# Patient Record
Sex: Male | Born: 1995
Health system: Southern US, Community
[De-identification: ages and names within clinical notes are randomized; demographics above are authoritative.]

---

## 2013-12-26 ENCOUNTER — Ambulatory Visit: Payer: BC Managed Care – PPO

## 2013-12-26 ENCOUNTER — Ambulatory Visit (INDEPENDENT_AMBULATORY_CARE_PROVIDER_SITE_OTHER): Payer: BC Managed Care – PPO | Admitting: Family Medicine

## 2013-12-26 VITALS — BP 110/70 | HR 52 | Temp 98.9°F | Resp 18 | Ht 66.0 in | Wt 151.8 lb

## 2013-12-26 DIAGNOSIS — M79609 Pain in unspecified limb: Secondary | ICD-10-CM

## 2013-12-26 DIAGNOSIS — M79672 Pain in left foot: Secondary | ICD-10-CM

## 2013-12-26 DIAGNOSIS — S92909A Unspecified fracture of unspecified foot, initial encounter for closed fracture: Secondary | ICD-10-CM

## 2013-12-26 NOTE — Progress Notes (Signed)
   Subjective:    Patient ID: Jackson Williams, male    DOB: 03-Oct-1995, 18 y.o.   MRN: [id number]  HPI Patient is a 18 year old from [place], who attends [employer]. 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 He jumped and is not sure if he landed with the toe flexed or bumped into someone or if someone landed on it. Patient took ibuprofen 400 mg with some relief of pain. He applied some cream that he brought from [place], but not sure what it contains.  Patient has no medical or surgical history. His parents are alive and well.    Review of Systems     Objective:   Physical Exam  Vitals reviewed. Constitutional: He is oriented to person, place, and time. He appears well-developed and well-nourished.  Eyes: Conjunctivae are normal.  Neck: Normal range of motion. Neck supple.  Pulmonary/Chest: Effort normal.  Neurological: He is alert and oriented to person, place, and time.  Skin: Skin is warm and dry.  Psychiatric: He has a normal mood and affect. His behavior is normal. Judgment and thought content normal.   Left foot with ecymosis covering 1 st, 2 nd and medial aspect of 3 rd toe. Pain with flexion and extension of toes, tender over metatarsals. Redness also over medial area of great toe.   Left foot xray: [facility] reading (PRIMARY) by  Dr. Katrinka BlazingSmith: Distal phalanx fracture at the IP joint.      Assessment & Plan:  1. Foot fracture - Ambulatory referral to Orthopedic Surgery. Ortho offices closed tomorrow Peri Jefferson(Good Friday), will ask for him to be seen at the beginning of next week. -Short cam boot fitted/applied -Ibuprofen 400 mg every 8 hours as needed for pain  2. Left foot pain - DG Foot Complete Left; Future   Emi Belfasteborah B. Gessner, FNP-BC  [facility]  12/26/2013 9:04 PM

## 2013-12-26 NOTE — Patient Instructions (Signed)
Wear protective boot when walking. Take ibuprofen 400 mg by mouth as needed for pain Our office will call with a referral to an orthopaedist

## 2014-12-26 ENCOUNTER — Ambulatory Visit (INDEPENDENT_AMBULATORY_CARE_PROVIDER_SITE_OTHER): Payer: 59 | Admitting: Family Medicine

## 2014-12-26 VITALS — BP 110/68 | HR 68 | Temp 97.6°F | Resp 14 | Ht 66.0 in | Wt 146.8 lb

## 2014-12-26 DIAGNOSIS — L03319 Cellulitis of trunk, unspecified: Secondary | ICD-10-CM | POA: Diagnosis not present

## 2014-12-26 DIAGNOSIS — R21 Rash and other nonspecific skin eruption: Secondary | ICD-10-CM | POA: Diagnosis not present

## 2014-12-26 MED ORDER — CEPHALEXIN 500 MG PO CAPS: 500.0000 mg | ORAL_CAPSULE | Freq: Two times a day (BID) | ORAL | Status: DC

## 2014-12-26 MED ORDER — PREDNISONE 20 MG PO TABS: 40.0000 mg | ORAL_TABLET | Freq: Every day | ORAL | Status: DC

## 2014-12-26 NOTE — Progress Notes (Signed)
This chart was scribed for Dr. Elvina Sidle, MD by Jarvis Morgan, Medical Scribe. This patient was seen in Room 3 and the patient's care was started at 2:53 PM.  Patient ID: Jackson Williams MRN: [id number], DOB: 1996-07-03, 19 y.o. Date of Encounter: 12/26/2014, 2:53 PM  Primary Physician: No primary care provider on file.  Chief Complaint:  Chief Complaint  Patient presents with   Rash    on left arm, neck, and chest x 1 week.      HPI: 19 y.o. year old male with history below presents with red, itchy rash to his left arm, right side of neck and chest for 1 week. He states they started out looking like bug bites and gradually became bigger, flatter and redder. Pt states the rash started to his left arm and neck. He denies rash to lower extremities. He denies any current medications. Pt denies any known allergies. Pt has no other complaints at this time.   History reviewed. No pertinent past medical history.   Home Meds: Prior to Admission medications   Not on File    Allergies: No Known Allergies  History   Social History   Marital Status: Single    Spouse Name: N/A   Number of Children: N/A   Years of Education: N/A   Occupational History   Not on file.   Social History Main Topics   Smoking status: Never Smoker    Smokeless tobacco: Not on file   Alcohol Use: No   Drug Use: No   Sexual Activity: Not on file   Other Topics Concern   Not on file   Social History Narrative     Review of Systems: Constitutional: negative for chills, fever, night sweats, weight changes, or fatigue  HEENT: negative for vision changes, hearing loss, congestion, rhinorrhea, ST, epistaxis, or sinus pressure Cardiovascular: negative for chest pain or palpitations Respiratory: negative for hemoptysis, wheezing, shortness of breath, or cough Abdominal: negative for abdominal pain, nausea, vomiting, diarrhea, or constipation Dermatological: positive for  rash Neurologic: negative for headache, dizziness, or syncope All other systems reviewed and are otherwise negative with the exception to those above and in the HPI.   Physical Exam: Blood pressure 110/68, pulse 68, temperature 97.6 F (36.4 C), temperature source Oral, resp. rate 14, height  (1.676 m), weight 146 lb 12.8 oz (66.588 kg), SpO2 99 %., Body mass index is 23.71 kg/(m^2). General: Well developed, well nourished, in no acute distress. Head: Normocephalic, atraumatic, eyes without discharge, sclera non-icteric, nares are without discharge. Bilateral auditory canals clear, TM's are without perforation, pearly grey and translucent with reflective cone of light bilaterally. Oral cavity moist, posterior pharynx without exudate, erythema, peritonsillar abscess, or post nasal drip.  Neck: Supple. No thyromegaly. Full ROM. No lymphadenopathy. Lungs: Clear bilaterally to auscultation without wheezes, rales, or rhonchi. Breathing is unlabored. Heart: RRR with S1 S2. No murmurs, rubs, or gallops appreciated. Abdomen: Soft, non-tender, non-distended with normoactive bowel sounds. No hepatomegaly. No rebound/guarding. No obvious abdominal masses. Msk:  Strength and tone normal for age. Extremities/Skin: Warm and dry. No clubbing or cyanosis. No edema. Indurated rash on his neck, right upper chest and left antecubital area. He also has multiple papules on the left chest and upper abdomen Neuro: Alert and oriented X 3. Moves all extremities spontaneously. Gait is normal. CNII-XII grossly in tact. Psych:  Responds to questions appropriately with a normal affect.   Labs:   ASSESSMENT AND PLAN:  19 y.o. year old male  with   1. Rash and nonspecific skin eruption   2. Cellulitis of trunk, unspecified site     Meds ordered this encounter  Medications   predniSONE (DELTASONE) 20 MG tablet    Sig: Take 2 tablets (40 mg total) by mouth daily.    Dispense:  10 tablet    Refill:  0    cephALEXin (KEFLEX) 500 MG capsule    Sig: Take 1 capsule (500 mg total) by mouth 2 (two) times daily.    Dispense:  14 capsule    Refill:  0   This chart was scribed in my presence and reviewed by me personally.    ICD-9-CM ICD-10-CM   1. Rash and nonspecific skin eruption 782.1 R21 predniSONE (DELTASONE) 20 MG tablet     cephALEXin (KEFLEX) 500 MG capsule  2. Cellulitis of trunk, unspecified site 682.2 L03.319 predniSONE (DELTASONE) 20 MG tablet     cephALEXin (KEFLEX) 500 MG capsule    Signed, Elvina SidleKurt Lauenstein, MD 12/26/2014 2:59 PM

## 2014-12-26 NOTE — Patient Instructions (Signed)
This rash has appearance of an allergic reaction it has gotten infected. This can happen after insect bites which I think is most likely

## 2015-01-02 ENCOUNTER — Ambulatory Visit (INDEPENDENT_AMBULATORY_CARE_PROVIDER_SITE_OTHER): Payer: 59 | Admitting: Family Medicine

## 2015-01-02 VITALS — BP 110/80 | HR 69 | Temp 98.2°F | Resp 16 | Ht 66.5 in | Wt 147.0 lb

## 2015-01-02 DIAGNOSIS — L03319 Cellulitis of trunk, unspecified: Secondary | ICD-10-CM

## 2015-01-02 DIAGNOSIS — R21 Rash and other nonspecific skin eruption: Secondary | ICD-10-CM | POA: Diagnosis not present

## 2015-01-02 MED ORDER — PREDNISONE 20 MG PO TABS: 40.0000 mg | ORAL_TABLET | Freq: Every day | ORAL | Status: DC

## 2015-01-02 MED ORDER — CLOTRIMAZOLE-BETAMETHASONE 1-0.05 % EX CREA: 1.0000 "application " | TOPICAL_CREAM | Freq: Two times a day (BID) | CUTANEOUS | Status: DC

## 2015-01-02 NOTE — Progress Notes (Signed)
Subjective:  This chart was scribed for Jackson SidleKurt Lauenstein, MD by Jackson Williams, Medical Scribe. This patient was seen in Room 13 and the patient's care was started at 2:44 PM.   Patient ID: Jackson Williams, male    DOB: December 23, 1995, 19 y.o.   MRN: [id number]  HPI HPI Comments: Jackson Williams is a 19 y.o. male who [facility] for a follow-up appointment regarding a rash on his left arm, right side of his neck, and chest.  The area on his neck and chest have resolved but the area on his left arm is still very red and itchy.  He has not noticed any rash between his fingers.  He does not recall being bitten by a tick.  He denies fever as an associated symptom.   No past medical history on file. No past surgical history on file. No family history on file. History   Social History   Marital Status: Single    Spouse Name: N/A   Number of Children: N/A   Years of Education: N/A   Occupational History   Not on file.   Social History Main Topics   Smoking status: Never Smoker    Smokeless tobacco: Not on file   Alcohol Use: No   Drug Use: No   Sexual Activity: Not on file   Other Topics Concern   Not on file   Social History Narrative   No Known Allergies  Review of Systems  Constitutional: Negative for fever.  Skin: Positive for rash.     Objective:  Physical Exam  Constitutional: He is oriented to person, place, and time. He appears well-developed and well-nourished.  HENT:  Head: Normocephalic and atraumatic.  Eyes: EOM are normal.  Neck: Normal range of motion.  Cardiovascular: Normal rate.   Pulmonary/Chest: Effort normal.  Musculoskeletal: Normal range of motion.  Neurological: He is alert and oriented to person, place, and time.  Skin: Skin is warm and dry. Rash noted.  Psychiatric: He has a normal mood and affect. His behavior is normal.  Nursing note and vitals reviewed.  is a very interesting distribution of  rash which is actually improving everywhere. He has a confluent area in his right neck which is dry and scaly, no longer elevated but definitely erythematous. He has an area on his left antecubital arm that is the same characteristics is what he has on his neck: Dry central area with scaly S and diffuse erythema Finally he has an area on his left chest which is more papular although the papules are becoming confluent. The rash once again is erythematous but turning violaceous and assess it with some scaling suggesting resolution.  He has no palmar or interdigital rash   BP 110/80 mmHg   Pulse 69   Temp(Src) 98.2 F (36.8 C) (Oral)   Resp 16   Ht 5' 6.5" (1.689 m)   Wt 147 lb (66.679 kg)   BMI 23.37 kg/m2   SpO2 99% Assessment & Plan:   Peculiar rash sits most consistent with cellulitis stemming from an allergic reaction This chart was scribed in my presence and reviewed by me personally.    ICD-9-CM ICD-10-CM   1. Rash and nonspecific skin eruption 782.1 R21 clotrimazole-betamethasone (LOTRISONE) cream     predniSONE (DELTASONE) 20 MG tablet  2. Cellulitis of trunk, unspecified site 682.2 L03.319 clotrimazole-betamethasone (LOTRISONE) cream     predniSONE (DELTASONE) 20 MG tablet     Signed, Jackson SidleKurt Lauenstein, MD

## 2016-01-18 ENCOUNTER — Ambulatory Visit (INDEPENDENT_AMBULATORY_CARE_PROVIDER_SITE_OTHER): Payer: BLUE CROSS/BLUE SHIELD | Admitting: Family Medicine

## 2016-01-18 VITALS — BP 120/82 | HR 72 | Temp 98.6°F | Resp 18 | Ht 67.5 in | Wt 153.0 lb

## 2016-01-18 DIAGNOSIS — B070 Plantar wart: Secondary | ICD-10-CM

## 2016-01-18 NOTE — Patient Instructions (Addendum)
Take either Pepcid  (famotidine) or Tagamet (cimetidine) over-the-counter twice a day for a month and this usually will kill the virus.

## 2016-01-18 NOTE — Progress Notes (Signed)
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  Objective:BP 120/82 mmHg  Pulse 72  Temp(Src) 98.6 F (37 C) (Oral)  Resp 18  Ht 5' 7.5" (1.715 m)  Wt 153 lb (69.4 kg)  BMI 23.60 kg/m2  SpO2 98% Examination of the ball of the great toe reveals a typical plantar wart. This was debrided until we reached the blood vessel.  Assessment: Plantar wart  Plan: Patient is to try famotidine or cimetidine twice a day over-the-counter medicine for one month.  Signed, Sheila OatsKurt Randale Carvalho M.D.

## 2017-12-07 ENCOUNTER — Encounter: Payer: Self-pay | Admitting: Urgent Care

## 2017-12-07 ENCOUNTER — Ambulatory Visit (INDEPENDENT_AMBULATORY_CARE_PROVIDER_SITE_OTHER): Payer: BLUE CROSS/BLUE SHIELD | Admitting: Urgent Care

## 2017-12-07 VITALS — BP 129/78 | HR 73 | Temp 98.6°F | Resp 16 | Ht 67.5 in | Wt 153.2 lb

## 2017-12-07 DIAGNOSIS — Z026 Encounter for examination for insurance purposes: Secondary | ICD-10-CM | POA: Diagnosis not present

## 2017-12-07 DIAGNOSIS — M79601 Pain in right arm: Secondary | ICD-10-CM

## 2017-12-07 DIAGNOSIS — T22211A Burn of second degree of right forearm, initial encounter: Secondary | ICD-10-CM | POA: Diagnosis not present

## 2017-12-07 DIAGNOSIS — Z23 Encounter for immunization: Secondary | ICD-10-CM

## 2017-12-07 DIAGNOSIS — T22231A Burn of second degree of right upper arm, initial encounter: Secondary | ICD-10-CM

## 2017-12-07 MED ORDER — SILVER SULFADIAZINE 1 % EX CREA: 1.0000 "application " | TOPICAL_CREAM | Freq: Every day | CUTANEOUS | 1 refills | Status: DC

## 2017-12-07 NOTE — Patient Instructions (Addendum)
Please change your dressing daily. Use a generous amount of Silvadene cream, cover with non-adherent dressings and then wrap with Kerlix. Use an Ace bandage to secure everything. For cleaning your wound, soak in barely warm soapy water. You may use this technique to remove bandages.     Burn Care, Adult A burn is an injury to the skin or the tissues under the skin. There are three types of burns:  First degree. These burns may cause the skin to be red and slightly swollen.  Second degree. These burns are very painful and cause the skin to be very red. The skin may also leak fluid, look shiny, and develop blisters.  Third degree. These burns cause permanent damage. They either turn the skin white or black and make it look charred, dry, and leathery.  Taking care of your burn properly can help to prevent pain and infection. It can also help the burn to heal more quickly. What are the risks? Complications from burns include:  Damage to the skin.  Reduced blood flow near the injury.  Dead tissue.  Scarring.  Problems with movement, if the burn happened near a joint or on the hands or feet.  Severe burns can lead to problems that affect the whole body, such as:  Fluid loss.  Less blood circulating in the body.  Inability to maintain a normal core body temperature (thermoregulation).  Infection.  Shock.  Problems breathing.  How to care for a first-degree burn Right after a burn:  Rinse or soak the burn under cool water until the pain stops. Do not put ice on your burn. This can cause more damage.  Lightly cover the burn with a sterile cloth (dressing). Burn care  Follow instructions from your health care provider about: ? How to clean and take care of the burn. ? When to change and remove the dressing.  Check your burn every day for signs of infection. Check for: ? More redness, swelling, or pain. ? Warmth. ? Pus or a bad smell. Medicine  Take over-the-counter  and prescription medicines only as told by your health care provider.  If you were prescribed antibiotic medicine, take or apply it as told by your health care provider. Do not stop using the antibiotic even if your condition improves. General instructions  To prevent infection, do not put butter, oil, or other home remedies on your burn.  Do not rub your burn, even when you are cleaning it.  Protect your burn from the sun. How to care for a second-degree burn Right after a burn:  Rinse or soak the burn under cool water. Do this for several minutes. Do not put ice on your burn. This can cause more damage.  Lightly cover the burn with a sterile cloth (dressing). Burn care  Raise (elevate) the injured area above the level of your heart while sitting or lying down.  Follow instructions from your health care provider about: ? How to clean and take care of the burn. ? When to change and remove the dressing.  Check your burn every day for signs of infection. Check for: ? More redness, swelling, or pain. ? Warmth. ? Pus or a bad smell. Medicine   Take over-the-counter and prescription medicines only as told by your health care provider.  If you were prescribed antibiotic medicine, take or apply it as told by your health care provider. Do not stop using the antibiotic even if your condition improves. General instructions  To prevent infection: ?  Do not put butter, oil, or other home remedies on the burn. ? Do not scratch or pick at the burn. ? Do not break any blisters. ? Do not peel skin.  Do not rub your burn, even when you are cleaning it.  Protect your burn from the sun. How to care for a third-degree burn Right after a burn:  Lightly cover the burn with gauze.  Seek immediate medical attention. Burn care  Raise (elevate) the injured area above the level of your heart while sitting or lying down.  Drink enough fluid to keep your urine clear or pale yellow.  Rest  as told by your health care provider. Do not participate in sports or other physical activities until your health care provider approves.  Follow instructions from your health care provider about: ? How to clean and take care of the burn. ? When to change and remove the dressing.  Check your burn every day for signs of infection. Check for: ? More redness, swelling, or pain. ? Warmth. ? Pus or a bad smell. Medicine  Take over-the-counter and prescription medicines only as told by your health care provider.  If you were prescribed antibiotic medicine, take or apply it as told by your health care provider. Do not stop using the antibiotic even if your condition improves. General instructions  To prevent infection: ? Do not put butter, oil, or other home remedies on the burn. ? Do not scratch or pick at the burn. ? Do not break any blisters. ? Do not peel skin. ? Do not rub your burn, even when you are cleaning it.  Protect your burn from the sun.  Keep all follow-up visits as told by your health care provider. This is important. Contact a health care provider if:  Your condition does not improve.  Your condition gets worse.  You have a fever.  Your burn changes in appearance or develops black or red spots.  Your burn feels warm to the touch.  Your pain is not controlled with medicine. Get help right away if:  You have redness, swelling, or pain at the site of the burn.  You have fluid, blood, or pus coming from your burn.  You have red streaks near the burn.  You have severe pain. This information is not intended to replace advice given to you by your health care provider. Make sure you discuss any questions you have with your health care provider. Document Released: 09/12/2005 Document Revised: 04/03/2016 Document Reviewed: 03/01/2016 Elsevier Interactive Patient Education  2018 [employer].     IF you received an x-ray today, you will receive an invoice from  [facility]. Please contact [facility] at [phone] with questions or concerns regarding your invoice.   IF you received labwork today, you will receive an invoice from [facility]. Please contact [facility] at [phone] with questions or concerns regarding your invoice.   Our billing staff will not be able to assist you with questions regarding bills from these companies.  You will be contacted with the lab results as soon as they are available. The fastest way to get your results is to activate your My Chart account. Instructions are located on the last page of this paperwork. If you have not heard from [place] regarding the results in 2 weeks, please contact this office.

## 2017-12-07 NOTE — Progress Notes (Signed)
    MRN: [id number] DOB: 07-28-1996  Subjective:   Jackson Williams is a 22 y.o. male presenting for suffering a burn to his right arm while at work last night. Patient was handling a deep fryer, lost control and hot oil splashed onto his right forearm and upper arm. He felt significant pain then which has improved. He has contacted his supervisor. Denies fever, red streaks, swelling of his arm outside of the blisters that developed over night. Denies n/v, abdominal pain. Needs to have his tdap updated.  [facility] has a current medication's list, allergies, pmh, psh that were reviewed, updated as appropriate and not included due to being a worker's compensation claim.  Objective:   Vitals: BP 129/78   Pulse 73   Temp 98.6 F (37 C) (Oral)   Resp 16   Ht 5' 7.5" (1.715 m)   Wt 153 lb 3.2 oz (69.5 kg)   SpO2 98%   BMI 23.64 kg/m   Physical Exam  Constitutional: He is oriented to person, place, and time. He appears well-developed and well-nourished.  Cardiovascular: Normal rate.  Pulmonary/Chest: Effort normal.  Neurological: He is alert and oriented to person, place, and time.  Skin: Burn (partial thickness burn with large blisters as depicted) noted. There is erythema.       Assessment and Plan :   Partial thickness burn of right upper arm, initial encounter  Partial thickness burn of right forearm, initial encounter  Right arm pain  Encounter related to worker's compensation claim  Performed wound care today. Reviewed wound care with patient. He will rtc on Saturday for a recheck. Tdap updated today. Return-to-clinic precautions discussed, patient verbalized understanding.   Wallis BambergMario Jaymison Luber, PA-C [facility] [phone] 12/07/2017  4:07 PM

## 2017-12-09 ENCOUNTER — Ambulatory Visit (INDEPENDENT_AMBULATORY_CARE_PROVIDER_SITE_OTHER): Payer: BLUE CROSS/BLUE SHIELD | Admitting: Urgent Care

## 2017-12-09 ENCOUNTER — Encounter: Payer: Self-pay | Admitting: Urgent Care

## 2017-12-09 ENCOUNTER — Other Ambulatory Visit: Payer: Self-pay

## 2017-12-09 VITALS — BP 98/62 | HR 74 | Temp 99.5°F | Resp 16 | Ht 67.5 in | Wt 152.4 lb

## 2017-12-09 DIAGNOSIS — T22231D Burn of second degree of right upper arm, subsequent encounter: Secondary | ICD-10-CM

## 2017-12-09 DIAGNOSIS — Z026 Encounter for examination for insurance purposes: Secondary | ICD-10-CM | POA: Diagnosis not present

## 2017-12-09 DIAGNOSIS — M79601 Pain in right arm: Secondary | ICD-10-CM | POA: Diagnosis not present

## 2017-12-09 DIAGNOSIS — T22211D Burn of second degree of right forearm, subsequent encounter: Secondary | ICD-10-CM

## 2017-12-09 NOTE — Patient Instructions (Addendum)
Please change your dressing daily. Use a generous amount of Silvadene cream, cover with non-adherent dressings and then wrap with Kerlix. Use an Ace bandage to secure everything. For cleaning your wound, soak in barely warm soapy water. You may use this technique to remove bandages.     Second-Degree Burn, Adult A second-degree burn, also called a partial thickness wound, is a serious injury that affects the first two layers of skin. A second-degree burn may be minor or major, depending on the size of the burn and which parts of the skin are burned. What are the causes? This condition may be caused by:  Heat. Burns caused by heat happen when skin comes in contact with something very hot, such as a flame or hot liquid.  Radiation. Sources of radiation include sunlight, radiation used to heat food, and radiation treatments.  Electricity. Burns caused by electricity happen when electricity passes through the body from lightning, wiring, electrical outlets, appliances, or power lines.  Certain chemicals, such as acids that come in contact with the skin or eyes. Some chemicals can go through clothing.  What increases the risk? This condition is more likely to occur in people who:  Are often exposed to high-risk environments, such as those with open flames, chemicals, or electricity.  Have cancer and are being treated with radiation.  What are the signs or symptoms? Symptoms of this condition include:  Severe pain.  Skin that is deep red, blistered, tender, and swollen.  Skin that has changed color.  Skin that looks blotchy, wet, or shiny.  How is this diagnosed? This condition is usually diagnosed with an exam of the wounded area. To get a better look at the wound, your health care provider may remove any blistered skin. It may take several days to find out if you have a second-degree burn because the signs of this kind of burn can take time to develop. You may need to watch the wound  for changes at home and visit a health care provider repeatedly to have your wound checked for changes. If the wound is large, you may need to stay in the hospital so a health care team can examine the wound for a few days. How is this treated? Treatment depends on the severity and cause of the burn. Healing may take several weeks. Some second-degree burns, including major burns, electrical burns, and chemical burns, may need to be treated in a hospital. Treatment may involve:  Cooling the burn with cool, germ-free (sterile) water.  Taking or applying medicines, such as: ? Medicines to relieve pain or itching. ? Ointments to treat or prevent infection. ? Antibiotic medicine to treat or prevent infection.  Getting a tetanus shot.  Covering the burn with a bandage (dressing). If your fingers or toes were burned, each of them may be bandaged separately.  Compression dressings to prevent scarring and help the burned body part stay moveable.  Removing dead skin. This is done by a health care provider. Do not try to remove dead skin yourself.  Deep burns can cause skin tissue to die, and a scab (eschar) may form where the skin used to be. If you have a deep burn and an eschar forms, you may need surgery to remove the eschar so the skin can heal properly. If your wound is deep and large (it covers more than 15% of your skin), treatment may also involve:  Receiving fluids and nutrition.  Close monitoring of blood flow near the wound.  Oxygen  given through a mask or a machine (ventilator). This may be needed if a burn causes fluid shifts in the body that make it hard to breathe.  Follow these instructions at home: Wound care  Follow instructions from your health care provider about how to take care of your wound. Make sure you: ? Wash your hands with soap and water before you change your dressing. If soap and water are not available, use hand sanitizer. ? Change your dressing as  directed. ? If you have a compression dressing, wear it as directed.  Clean your wound 2-3 times a day or as often as directed. ? Wash the wound with mild soap and water. ? Rinse the wound with water to remove all soap. ? Pat the wound dry with a clean towel. Do not rub it.  Check your wound every day for signs of infection. Check for: ? More redness, swelling, or pain. ? More fluid or blood. ? Warmth. ? Pus or a bad smell. ? Yellow or green fluid.  Do not scratch or pick at the wound  Do not break any blisters or peel any skin.  Avoid exposing your wound to the sun. Medicine  Take and apply over-the-counter and prescription medicines only as told by your health care provider.  Take or apply your antibiotic medicine as told by your health care provider. Do not stop using the antibiotic even if you start to feel better. Eating and drinking  Drink enough fluid to keep your urine clear or pale yellow.  Eat a nutritious diet that is high in protein. This will help your wound to heal. General instructions  Raise (elevate) the injured area above the level of your heart while sitting or lying down.  Rest as directed. Do not exercise until your health care provider approves.  Do not take baths, swim, or use a hot tub until your health care provider approves.  Do not put ice on your burn. This can cause more damage. Try cooling the burn with: ? Cool water. ? A cool, wet, clean cloth (cool compress).  Keep all follow-up visits as directed. This is important. Contact a health care provider if:  Your symptoms do not improve with treatment.  Your pain is not relieved with medicine.  You have more redness, swelling, or pain around your wound.  You have more fluid or blood coming from your wound.  You have yellow or green fluid, pus, or a bad smell coming from your wound.  Your wound feels warm to the touch.  You have a fever. Get help right away if:  You develop red  streaks near the wound.  You develop severe pain. Summary  A second-degree burn is a serious injury that affects the first two layers of skin.  Clean your wound 2-3 times a day or as often as directed. Check your wound every day for signs of infection.  Do not scratch or pick at your wound, break blisters, peel skin, or put ice on your burn. This information is not intended to replace advice given to you by your health care provider. Make sure you discuss any questions you have with your health care provider. Document Released: 02/14/2011 Document Revised: 08/23/2016 Document Reviewed: 08/23/2016 Elsevier Interactive Patient Education  2018 [employer].     IF you received an x-ray today, you will receive an invoice from [facility]. Please contact [facility] at [phone] with questions or concerns regarding your invoice.   IF you received labwork  today, you will receive an invoice from [place]. Please contact [facility] at [phone] with questions or concerns regarding your invoice.   Our billing staff will not be able to assist you with questions regarding bills from these companies.  You will be contacted with the lab results as soon as they are available. The fastest way to get your results is to activate your My Chart account. Instructions are located on the last page of this paperwork. If you have not heard from [place] regarding the results in 2 weeks, please contact this office.
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
## 2017-12-12 ENCOUNTER — Ambulatory Visit (INDEPENDENT_AMBULATORY_CARE_PROVIDER_SITE_OTHER): Payer: BLUE CROSS/BLUE SHIELD | Admitting: Urgent Care

## 2017-12-12 ENCOUNTER — Encounter: Payer: Self-pay | Admitting: Urgent Care

## 2017-12-12 VITALS — BP 117/77 | HR 77 | Temp 98.4°F | Resp 16 | Ht 67.5 in

## 2017-12-12 DIAGNOSIS — T22211D Burn of second degree of right forearm, subsequent encounter: Secondary | ICD-10-CM | POA: Diagnosis not present

## 2017-12-12 DIAGNOSIS — Z026 Encounter for examination for insurance purposes: Secondary | ICD-10-CM

## 2017-12-12 DIAGNOSIS — T22231D Burn of second degree of right upper arm, subsequent encounter: Secondary | ICD-10-CM | POA: Diagnosis not present

## 2017-12-12 DIAGNOSIS — M79601 Pain in right arm: Secondary | ICD-10-CM | POA: Diagnosis not present

## 2017-12-12 NOTE — Patient Instructions (Signed)
     IF you received an x-ray today, you will receive an invoice from [facility]. Please contact  Radiology at [phone] with questions or concerns regarding your invoice.   IF you received labwork today, you will receive an invoice from [facility]. Please contact [facility] at [phone] with questions or concerns regarding your invoice.   Our billing staff will not be able to assist you with questions regarding bills from these companies.  You will be contacted with the lab results as soon as they are available. The fastest way to get your results is to activate your My Chart account. Instructions are located on the last page of this paperwork. If you have not heard from us regarding the results in 2 weeks, please contact this office.     
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
## 2017-12-16 ENCOUNTER — Encounter: Payer: Self-pay | Admitting: Urgent Care

## 2017-12-16 ENCOUNTER — Ambulatory Visit (INDEPENDENT_AMBULATORY_CARE_PROVIDER_SITE_OTHER): Payer: BLUE CROSS/BLUE SHIELD | Admitting: Urgent Care

## 2017-12-16 VITALS — BP 123/71 | HR 72 | Temp 98.8°F | Resp 16 | Ht 67.5 in | Wt 152.0 lb

## 2017-12-16 DIAGNOSIS — Z026 Encounter for examination for insurance purposes: Secondary | ICD-10-CM

## 2017-12-16 DIAGNOSIS — M79601 Pain in right arm: Secondary | ICD-10-CM

## 2017-12-16 DIAGNOSIS — T22231D Burn of second degree of right upper arm, subsequent encounter: Secondary | ICD-10-CM | POA: Diagnosis not present

## 2017-12-16 DIAGNOSIS — T22211D Burn of second degree of right forearm, subsequent encounter: Secondary | ICD-10-CM

## 2017-12-16 NOTE — Patient Instructions (Addendum)
Continue dressing changes daily. Make sure you continue applying Silvadene cream to your wound.     IF you received an x-ray today, you will receive an invoice from [facility]. Please contact [facility] at [phone] with questions or concerns regarding your invoice.   IF you received labwork today, you will receive an invoice from [facility]. Please contact [facility] at [phone] with questions or concerns regarding your invoice.   Our billing staff will not be able to assist you with questions regarding bills from these companies.  You will be contacted with the lab results as soon as they are available. The fastest way to get your results is to activate your My Chart account. Instructions are located on the last page of this paperwork. If you have not heard from us regarding the results in 2 weeks, please contact this office.
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
## 2017-12-22 ENCOUNTER — Encounter: Payer: Self-pay | Admitting: Urgent Care

## 2017-12-22 ENCOUNTER — Other Ambulatory Visit: Payer: Self-pay

## 2017-12-22 ENCOUNTER — Ambulatory Visit (INDEPENDENT_AMBULATORY_CARE_PROVIDER_SITE_OTHER): Payer: BLUE CROSS/BLUE SHIELD | Admitting: Urgent Care

## 2017-12-22 VITALS — BP 100/70 | HR 73 | Temp 97.9°F | Ht 67.0 in | Wt 151.4 lb

## 2017-12-22 DIAGNOSIS — Z026 Encounter for examination for insurance purposes: Secondary | ICD-10-CM | POA: Diagnosis not present

## 2017-12-22 DIAGNOSIS — T22231D Burn of second degree of right upper arm, subsequent encounter: Secondary | ICD-10-CM

## 2017-12-22 DIAGNOSIS — T22211D Burn of second degree of right forearm, subsequent encounter: Secondary | ICD-10-CM

## 2017-12-22 DIAGNOSIS — Z5189 Encounter for other specified aftercare: Secondary | ICD-10-CM | POA: Diagnosis not present

## 2017-12-22 NOTE — Patient Instructions (Addendum)
Burn Care, Adult  A burn is an injury to the skin or the tissues under the skin. There are three types of burns:  · First degree. These burns may cause the skin to be red and slightly swollen.  · Second degree. These burns are very painful and cause the skin to be very red. The skin may also leak fluid, look shiny, and develop blisters.  · Third degree. These burns cause permanent damage. They either turn the skin white or black and make it look charred, dry, and leathery.    Taking care of your burn properly can help to prevent pain and infection. It can also help the burn to heal more quickly.  What are the risks?  Complications from burns include:  · Damage to the skin.  · Reduced blood flow near the injury.  · Dead tissue.  · Scarring.  · Problems with movement, if the burn happened near a joint or on the hands or feet.    Severe burns can lead to problems that affect the whole body, such as:  · Fluid loss.  · Less blood circulating in the body.  · Inability to maintain a normal core body temperature (thermoregulation).  · Infection.  · Shock.  · Problems breathing.    How to care for a first-degree burn  Right after a burn:  · Rinse or soak the burn under cool water until the pain stops. Do not put ice on your burn. This can cause more damage.  · Lightly cover the burn with a sterile cloth (dressing).  Burn care  · Follow instructions from your health care provider about:  ? How to clean and take care of the burn.  ? When to change and remove the dressing.  · Check your burn every day for signs of infection. Check for:  ? More redness, swelling, or pain.  ? Warmth.  ? Pus or a bad smell.  Medicine  · Take over-the-counter and prescription medicines only as told by your health care provider.  · If you were prescribed antibiotic medicine, take or apply it as told by your health care provider. Do not stop using the antibiotic even if your condition improves.  General instructions  · To prevent infection, do not  put butter, oil, or other home remedies on your burn.  · Do not rub your burn, even when you are cleaning it.  · Protect your burn from the sun.  How to care for a second-degree burn  Right after a burn:  · Rinse or soak the burn under cool water. Do this for several minutes. Do not put ice on your burn. This can cause more damage.  · Lightly cover the burn with a sterile cloth (dressing).  Burn care  · Raise (elevate) the injured area above the level of your heart while sitting or lying down.  · Follow instructions from your health care provider about:  ? How to clean and take care of the burn.  ? When to change and remove the dressing.  · Check your burn every day for signs of infection. Check for:  ? More redness, swelling, or pain.  ? Warmth.  ? Pus or a bad smell.  Medicine    · Take over-the-counter and prescription medicines only as told by your health care provider.  · If you were prescribed antibiotic medicine, take or apply it as told by your health care provider. Do not stop using the antibiotic even if your   the sun. How to care for a third-degree burn Right after a burn:  Lightly cover the burn with gauze.  Seek immediate medical attention. Burn care  Raise (elevate) the injured area above the level of your heart while sitting or lying down.  Drink enough fluid to keep your urine clear or pale yellow.  Rest as told by your health care provider. Do not participate in sports or other physical activities until your health care provider approves.  Follow instructions from your health care provider about: ? How to clean and take care of the burn. ? When to change and remove the dressing.  Check  your burn every day for signs of infection. Check for: ? More redness, swelling, or pain. ? Warmth. ? Pus or a bad smell. Medicine  Take over-the-counter and prescription medicines only as told by your health care provider.  If you were prescribed antibiotic medicine, take or apply it as told by your health care provider. Do not stop using the antibiotic even if your condition improves. General instructions  To prevent infection: ? Do not put butter, oil, or other home remedies on the burn. ? Do not scratch or pick at the burn. ? Do not break any blisters. ? Do not peel skin. ? Do not rub your burn, even when you are cleaning it.  Protect your burn from the sun.  Keep all follow-up visits as told by your health care provider. This is important. Contact a health care provider if:  Your condition does not improve.  Your condition gets worse.  You have a fever.  Your burn changes in appearance or develops black or red spots.  Your burn feels warm to the touch.  Your pain is not controlled with medicine. Get help right away if:  You have redness, swelling, or pain at the site of the burn.  You have fluid, blood, or pus coming from your burn.  You have red streaks near the burn.  You have severe pain. This information is not intended to replace advice given to you by your health care provider. Make sure you discuss any questions you have with your health care provider. Document Released: 09/12/2005 Document Revised: 04/03/2016 Document Reviewed: 03/01/2016 Elsevier Interactive Patient Education  2018 [employer]     IF you received an x-ray today, you will receive an invoice from [facility]. Please contact [facility] at [phone] with questions or concerns regarding your invoice.   IF you received labwork today, you will receive an invoice from [facility]. Please contact [facility] at [phone] with questions or concerns regarding your invoice.    Our billing staff will not be able to assist you with questions regarding bills from these companies.  You will be contacted with the lab results as soon as they are available. The fastest way to get your results is to activate your My Chart account. Instructions are located on the last page of this paperwork. If you have not heard from us regarding the results in 2 weeks, please contact this office.
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
## 2017-12-29 ENCOUNTER — Ambulatory Visit (INDEPENDENT_AMBULATORY_CARE_PROVIDER_SITE_OTHER): Payer: BLUE CROSS/BLUE SHIELD | Admitting: Urgent Care

## 2017-12-29 ENCOUNTER — Encounter: Payer: Self-pay | Admitting: Urgent Care

## 2017-12-29 VITALS — BP 112/69 | HR 68 | Temp 98.2°F | Resp 17 | Ht 67.0 in | Wt 152.0 lb

## 2017-12-29 DIAGNOSIS — Z5189 Encounter for other specified aftercare: Secondary | ICD-10-CM

## 2017-12-29 DIAGNOSIS — T22211D Burn of second degree of right forearm, subsequent encounter: Secondary | ICD-10-CM

## 2017-12-29 DIAGNOSIS — T22231D Burn of second degree of right upper arm, subsequent encounter: Secondary | ICD-10-CM | POA: Diagnosis not present

## 2017-12-29 DIAGNOSIS — Z026 Encounter for examination for insurance purposes: Secondary | ICD-10-CM | POA: Diagnosis not present

## 2017-12-29 DIAGNOSIS — M79601 Pain in right arm: Secondary | ICD-10-CM
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
## 2017-12-29 NOTE — Patient Instructions (Addendum)
You may take 500mg  Tylenol with ibuprofen 400-600mg  every 6 hours for pain and inflammation.      Burn Care, Adult A burn is an injury to the skin or the tissues under the skin. There are three types of burns:  First degree. These burns may cause the skin to be red and slightly swollen.  Second degree. These burns are very painful and cause the skin to be very red. The skin may also leak fluid, look shiny, and develop blisters.  Third degree. These burns cause permanent damage. They either turn the skin white or black and make it look charred, dry, and leathery.  Taking care of your burn properly can help to prevent pain and infection. It can also help the burn to heal more quickly. What are the risks? Complications from burns include:  Damage to the skin.  Reduced blood flow near the injury.  Dead tissue.  Scarring.  Problems with movement, if the burn happened near a joint or on the hands or feet.  Severe burns can lead to problems that affect the whole body, such as:  Fluid loss.  Less blood circulating in the body.  Inability to maintain a normal core body temperature (thermoregulation).  Infection.  Shock.  Problems breathing.  How to care for a first-degree burn Right after a burn:  Rinse or soak the burn under cool water until the pain stops. Do not put ice on your burn. This can cause more damage.  Lightly cover the burn with a sterile cloth (dressing). Burn care  Follow instructions from your health care provider about: ? How to clean and take care of the burn. ? When to change and remove the dressing.  Check your burn every day for signs of infection. Check for: ? More redness, swelling, or pain. ? Warmth. ? Pus or a bad smell. Medicine  Take over-the-counter and prescription medicines only as told by your health care provider.  If you were prescribed antibiotic medicine, take or apply it as told by your health care provider. Do not stop using  the antibiotic even if your condition improves. General instructions  To prevent infection, do not put butter, oil, or other home remedies on your burn.  Do not rub your burn, even when you are cleaning it.  Protect your burn from the sun. How to care for a second-degree burn Right after a burn:  Rinse or soak the burn under cool water. Do this for several minutes. Do not put ice on your burn. This can cause more damage.  Lightly cover the burn with a sterile cloth (dressing). Burn care  Raise (elevate) the injured area above the level of your heart while sitting or lying down.  Follow instructions from your health care provider about: ? How to clean and take care of the burn. ? When to change and remove the dressing.  Check your burn every day for signs of infection. Check for: ? More redness, swelling, or pain. ? Warmth. ? Pus or a bad smell. Medicine   Take over-the-counter and prescription medicines only as told by your health care provider.  If you were prescribed antibiotic medicine, take or apply it as told by your health care provider. Do not stop using the antibiotic even if your condition improves. General instructions  To prevent infection: ? Do not put butter, oil, or other home remedies on the burn. ? Do not scratch or pick at the burn. ? Do not break any blisters. ? Do  not peel skin.  Do not rub your burn, even when you are cleaning it.  Protect your burn from the sun. How to care for a third-degree burn Right after a burn:  Lightly cover the burn with gauze.  Seek immediate medical attention. Burn care  Raise (elevate) the injured area above the level of your heart while sitting or lying down.  Drink enough fluid to keep your urine clear or pale yellow.  Rest as told by your health care provider. Do not participate in sports or other physical activities until your health care provider approves.  Follow instructions from your health care provider  about: ? How to clean and take care of the burn. ? When to change and remove the dressing.  Check your burn every day for signs of infection. Check for: ? More redness, swelling, or pain. ? Warmth. ? Pus or a bad smell. Medicine  Take over-the-counter and prescription medicines only as told by your health care provider.  If you were prescribed antibiotic medicine, take or apply it as told by your health care provider. Do not stop using the antibiotic even if your condition improves. General instructions  To prevent infection: ? Do not put butter, oil, or other home remedies on the burn. ? Do not scratch or pick at the burn. ? Do not break any blisters. ? Do not peel skin. ? Do not rub your burn, even when you are cleaning it.  Protect your burn from the sun.  Keep all follow-up visits as told by your health care provider. This is important. Contact a health care provider if:  Your condition does not improve.  Your condition gets worse.  You have a fever.  Your burn changes in appearance or develops black or red spots.  Your burn feels warm to the touch.  Your pain is not controlled with medicine. Get help right away if:  You have redness, swelling, or pain at the site of the burn.  You have fluid, blood, or pus coming from your burn.  You have red streaks near the burn.  You have severe pain. This information is not intended to replace advice given to you by your health care provider. Make sure you discuss any questions you have with your health care provider. Document Released: 09/12/2005 Document Revised: 04/03/2016 Document Reviewed: 03/01/2016 Elsevier Interactive Patient Education  2018 [employer]     IF you received an x-ray today, you will receive an invoice from [facility]. Please contact [facility] at [phone] with questions or concerns regarding your invoice.   IF you received labwork today, you will receive an invoice from  [facility]. Please contact [facility] at [phone] with questions or concerns regarding your invoice.   Our billing staff will not be able to assist you with questions regarding bills from these companies.  You will be contacted with the lab results as soon as they are available. The fastest way to get your results is to activate your My Chart account. Instructions are located on the last page of this paperwork. If you have not heard from us regarding the results in 2 weeks, please contact this office.

## 2019-09-03 ENCOUNTER — Ambulatory Visit: Payer: Self-pay

## 2020-02-17 ENCOUNTER — Emergency Department (HOSPITAL_COMMUNITY)
Admission: EM | Admit: 2020-02-17 | Discharge: 2020-02-17 | Disposition: A | Discharge status: Home / Self Care | Payer: Self-pay | Attending: Emergency Medicine | Admitting: Emergency Medicine

## 2020-02-17 ENCOUNTER — Encounter (HOSPITAL_COMMUNITY): Payer: Self-pay

## 2020-02-17 ENCOUNTER — Other Ambulatory Visit: Payer: Self-pay

## 2020-02-17 DIAGNOSIS — L509 Urticaria, unspecified: Secondary | ICD-10-CM | POA: Insufficient documentation

## 2020-02-17 DIAGNOSIS — R42 Dizziness and giddiness: Secondary | ICD-10-CM | POA: Insufficient documentation

## 2020-02-17 DIAGNOSIS — T63441A Toxic effect of venom of bees, accidental (unintentional), initial encounter: Secondary | ICD-10-CM | POA: Insufficient documentation

## 2020-02-17 MED ORDER — PREDNISONE 10 MG PO TABS
40.0000 mg | ORAL_TABLET | Freq: Every day | ORAL | 0 refills | Status: AC
Start: 2020-02-17 — End: 2020-02-22

## 2020-02-17 MED ORDER — EPINEPHRINE 0.3 MG/0.3ML IJ SOAJ: 0.3000 mg | INTRAMUSCULAR | 0 refills | Status: DC | PRN

## 2020-02-17 MED ORDER — SODIUM CHLORIDE 0.9 % IV BOLUS
1000.0000 mL | Freq: Once | INTRAVENOUS | Status: AC
  Administered 2020-02-17: 1000 mL via INTRAVENOUS

## 2020-02-17 MED ORDER — METHYLPREDNISOLONE SODIUM SUCC 125 MG IJ SOLR
125.0000 mg | Freq: Once | INTRAMUSCULAR | Status: AC
  Administered 2020-02-17: 125 mg via INTRAVENOUS
  Filled 2020-02-17: qty 2

## 2020-02-17 MED ORDER — FAMOTIDINE IN NACL 20-0.9 MG/50ML-% IV SOLN
20.0000 mg | Freq: Once | INTRAVENOUS | Status: AC
  Administered 2020-02-17: 20 mg via INTRAVENOUS
  Filled 2020-02-17: qty 50

## 2020-02-17 NOTE — Discharge Instructions (Signed)
You were seen in the ER for a rash  Suspect your rash is from an allergic reaction  Please take prednisone for the next 5 days.  Take 20 mg of famotidine daily and 25 mg of diphenhydramine every 8 hours for the next 3 days.  These medicines will help prevent return of symptoms.  I have given you prescription for an EpiPen.  Use the EpiPen if you develop any signs of severe allergic reaction like lip, tongue, throat swelling, difficulty breathing, chest pain

## 2020-02-17 NOTE — ED Triage Notes (Signed)
Pt was stung by a bee within the last hour. Pt started developing a rash all over. Pt denies any issues with breathing at this time. Pt is notably red and swollen. Family reports giving 50mg  of Benadryl about 20 minutes ago.

## 2020-02-17 NOTE — ED Provider Notes (Signed)
[facility] DEPT Provider Note   CSN: [id number] Arrival date & time: 02/17/20  1442     History Chief Complaint  Patient presents with  . Allergic Reaction    Jackson Williams is a 24 y.o. male presents to the ER for evaluation of possible allergenic reaction.  Patient was pulling out a bush from the ground and saw a swarm of bees.  He felt a sting in his left arm and approximately 15 minutes later he developed sudden onset generalized itchiness with raised bumps all over his body including his face, trunk, back, extremities.  Also has slight dizziness but this is resolved.  Noticed his lips are red but denies any facial lip tongue or throat swelling.  He took 50 mg of Benadryl prior to arrival and feels like the rash is less itchy.  He denies previous history of allergies but states that he gets a rash if he touches poison ivy.  He denies any headaches.  No chest pain, shortness of breath, tongue lip or throat swelling or discomfort.  No nausea, vomiting or abdominal pain. HPI     History reviewed. No pertinent past medical history.  There are no problems to display for this patient.   History reviewed. No pertinent surgical history.     History reviewed. No pertinent family history.  Social History   Tobacco Use  . Smoking status: Never Smoker  . Smokeless tobacco: Never Used  Substance Use Topics  . Alcohol use: No  . Drug use: No    Home Medications Prior to Admission medications   Medication Sig Start Date End Date Taking? Authorizing Provider  diphenhydrAMINE HCl (BENADRYL ALLERGY PO) Take 2 tablets by mouth daily as needed (for allergy, itching).   Yes [provider]  EPINEPHrine 0.3 mg/0.3 mL IJ SOAJ injection Inject 0.3 mLs (0.3 mg total) into the muscle as needed for anaphylaxis. 02/17/20   Kinnie Feil, PA-C  predniSONE (DELTASONE) 10 MG tablet Take 4 tablets (40 mg total) by mouth daily for 5 days. 02/17/20  02/22/20  Kinnie Feil, PA-C  silver sulfADIAZINE (SILVADENE) 1 % cream Apply 1 application topically daily. Patient not taking: Reported on 12/29/2017 12/07/17   Jaynee Eagles, PA-C    Allergies    Other  Review of Systems   Review of Systems  Skin: Positive for rash.       Itching   All other systems reviewed and are negative.   Physical Exam Updated Vital Signs BP 119/82   Pulse 74   Temp 98.1 F (36.7 C) (Oral)   Resp 14   Ht 5\' 7"  (1.702 m)   Wt 77.1 kg   SpO2 99%   BMI 26.63 kg/m   Physical Exam Vitals and nursing note reviewed.  Constitutional:      General: He is not in acute distress.    Appearance: He is well-developed.     Comments: NAD.  HENT:     Head: Normocephalic and atraumatic.     Right Ear: External ear normal.     Left Ear: External ear normal.     Nose: Nose normal.     Mouth/Throat:     Comments: Slight erythema around upper and lower lips but no obvious facial or lip angioedema.  Stepfather at bedside states his lips look red but do not look swollen.  Oropharynx, palate, tongue normal without edema or intraoral lesions. Eyes:     General: No scleral icterus.    Conjunctiva/sclera: Conjunctivae  normal.  Cardiovascular:     Rate and Rhythm: Normal rate and regular rhythm.     Heart sounds: Normal heart sounds.  Pulmonary:     Effort: Pulmonary effort is normal.     Breath sounds: Normal breath sounds.  Musculoskeletal:        General: Normal range of motion.     Cervical back: Normal range of motion and neck supple.  Skin:    General: Skin is warm and dry.     Capillary Refill: Capillary refill takes less than 2 seconds.     Findings: Rash present.     Comments: Diffuse urticaria around neck, trunk, upper and lower extremities.  No urticaria in the palms or soles of the feet.  Neurological:     Mental Status: He is alert and oriented to person, place, and time.  Psychiatric:        Behavior: Behavior normal.        Thought Content:  Thought content normal.        Judgment: Judgment normal.     ED Results / Procedures / Treatments   Labs (all labs ordered are listed, but only abnormal results are displayed) Labs Reviewed - No data to display  EKG None  Radiology No results found.  Procedures Procedures (including critical care time)  Medications Ordered in ED Medications  sodium chloride 0.9 % bolus 1,000 mL (1,000 mLs Intravenous New Bag/Given (Non-Interop) 02/17/20 1526)  methylPREDNISolone sodium succinate (SOLU-MEDROL) 125 mg/2 mL injection 125 mg (125 mg Intravenous Given 02/17/20 1530)  famotidine (PEPCID) IVPB 20 mg premix (20 mg Intravenous New Bag/Given (Non-Interop) 02/17/20 1532)    ED Course  I have reviewed the triage vital signs and the nursing notes.  Pertinent labs & imaging results that were available during my care of the patient were reviewed by me and considered in my medical decision making (see chart for details).    MDM Rules/Calculators/A&P                      Final Clinical Impressions(s) / ED Diagnoses   EMR and nursing notes reviewed to obtain further history and assist with MDM.   Exam shows diffuse urticaria.  Initially tachycardia in triage but this has completely resolved.  Reports initially feeling dizzy but this has also resolved.    Presentation most consistent with allergic reaction with cutaneous symptoms only.  Doubt infectious rash or thrompocytopenic rash.  Source likely bee sting.   No concerning signs of severe reaction. No respiratory distress. No CV compromise.  HD stable. No facial/oral angioedema.  No signs of anaphylaxis.   1600: Patient has taken benadryl already. Will give IVF, solumedrol, pepcid. Reassess. On cardiac monitor.     1655: Patient reevaluated after medicines, significant improvement in urticaria.  Hemodynamically stable.  No signs of angioedema.  No indication for epi here.  Appropriate for discharge with steroid taper, Pepcid, Benadryl  and prescription for EpiPen.  Educated on how and when to use epipen. Patient understands risk of relapse/biphasic reaction is possible but unlikely. Strict return precautions given. Patient comfortable with this plan.  Final Clinical Impression(s) / ED Diagnoses Final diagnoses:  Urticaria    Rx / DC Orders ED Discharge Orders         Ordered    predniSONE (DELTASONE) 10 MG tablet  Daily     02/17/20 1659    EPINEPHrine 0.3 mg/0.3 mL IJ SOAJ injection  As needed     02/17/20  [address]           Jerrell Mylar 02/17/20 1659    Alvira Monday, MD 02/18/20 1510

## 2020-02-29 ENCOUNTER — Other Ambulatory Visit: Payer: Self-pay

## 2020-02-29 ENCOUNTER — Emergency Department (HOSPITAL_COMMUNITY)
Admission: EM | Admit: 2020-02-29 | Discharge: 2020-02-29 | Disposition: A | Discharge status: Home / Self Care | Payer: Self-pay | Attending: Emergency Medicine | Admitting: Emergency Medicine

## 2020-02-29 DIAGNOSIS — R440 Auditory hallucinations: Secondary | ICD-10-CM | POA: Insufficient documentation

## 2020-02-29 DIAGNOSIS — F22 Delusional disorders: Secondary | ICD-10-CM | POA: Insufficient documentation

## 2020-02-29 DIAGNOSIS — F29 Unspecified psychosis not due to a substance or known physiological condition: Secondary | ICD-10-CM | POA: Insufficient documentation

## 2020-02-29 LAB — CBC WITH DIFFERENTIAL/PLATELET
Abs Immature Granulocytes: 0.1 10*3/uL — ABNORMAL HIGH (ref 0.00–0.07)
Basophils Absolute: 0.1 10*3/uL (ref 0.0–0.1)
Basophils Relative: 1 %
Eosinophils Absolute: 0.1 10*3/uL (ref 0.0–0.5)
Eosinophils Relative: 1 %
HCT: 51.5 % (ref 39.0–52.0)
Hemoglobin: 16.7 g/dL (ref 13.0–17.0)
Immature Granulocytes: 1 %
Lymphocytes Relative: 17 %
Lymphs Abs: 1.8 10*3/uL (ref 0.7–4.0)
MCH: 28.8 pg (ref 26.0–34.0)
MCHC: 32.4 g/dL (ref 30.0–36.0)
MCV: 88.9 fL (ref 80.0–100.0)
Monocytes Absolute: 0.7 10*3/uL (ref 0.1–1.0)
Monocytes Relative: 7 %
Neutro Abs: 7.6 10*3/uL (ref 1.7–7.7)
Neutrophils Relative %: 73 %
Platelets: 332 10*3/uL (ref 150–400)
RBC: 5.79 MIL/uL (ref 4.22–5.81)
RDW: 13.9 % (ref 11.5–15.5)
WBC: 10.2 10*3/uL (ref 4.0–10.5)
nRBC: 0 % (ref 0.0–0.2)

## 2020-02-29 LAB — RAPID URINE DRUG SCREEN, HOSP PERFORMED
Amphetamines: NOT DETECTED
Barbiturates: NOT DETECTED
Benzodiazepines: NOT DETECTED
Cocaine: NOT DETECTED
Opiates: NOT DETECTED
Tetrahydrocannabinol: NOT DETECTED

## 2020-02-29 LAB — COMPREHENSIVE METABOLIC PANEL
ALT: 21 U/L (ref 0–44)
AST: 21 U/L (ref 15–41)
Albumin: 4.9 g/dL (ref 3.5–5.0)
Alkaline Phosphatase: 73 U/L (ref 38–126)
Anion gap: 12 (ref 5–15)
BUN: 14 mg/dL (ref 6–20)
CO2: 25 mmol/L (ref 22–32)
Calcium: 9.5 mg/dL (ref 8.9–10.3)
Chloride: 102 mmol/L (ref 98–111)
Creatinine, Ser: 1.06 mg/dL (ref 0.61–1.24)
GFR calc Af Amer: >60 mL/min (ref >60)
GFR calc non Af Amer: >60 mL/min (ref >60)
Glucose, Bld: 92 mg/dL (ref 70–99)
Potassium: 3.6 mmol/L (ref 3.5–5.1)
Sodium: 139 mmol/L (ref 135–145)
Total Bilirubin: 0.7 mg/dL (ref 0.3–1.2)
Total Protein: 8.2 g/dL — ABNORMAL HIGH (ref 6.5–8.1)

## 2020-02-29 MED ORDER — ACETAMINOPHEN 325 MG PO TABS: 650.0000 mg | ORAL_TABLET | ORAL | Status: DC | PRN

## 2020-02-29 MED ORDER — ZOLPIDEM TARTRATE 5 MG PO TABS: 5.0000 mg | ORAL_TABLET | Freq: Every evening | ORAL | Status: DC | PRN

## 2020-02-29 MED ORDER — LORAZEPAM 1 MG PO TABS: 1.0000 mg | ORAL_TABLET | ORAL | Status: DC | PRN

## 2020-02-29 MED ORDER — ALUM & MAG HYDROXIDE-SIMETH 200-200-20 MG/5ML PO SUSP: 30.0000 mL | Freq: Four times a day (QID) | ORAL | Status: DC | PRN

## 2020-02-29 MED ORDER — SODIUM CHLORIDE 0.9 % IV BOLUS
1000.0000 mL | Freq: Once | INTRAVENOUS | Status: AC
  Administered 2020-02-29: 1000 mL via INTRAVENOUS

## 2020-02-29 MED ORDER — HYDROXYZINE HCL 25 MG PO TABS: 25.0000 mg | ORAL_TABLET | Freq: Four times a day (QID) | ORAL | 0 refills | Status: DC | PRN

## 2020-02-29 MED ORDER — ZIPRASIDONE MESYLATE 20 MG IM SOLR: 20.0000 mg | INTRAMUSCULAR | Status: DC | PRN

## 2020-02-29 MED ORDER — RISPERIDONE 1 MG PO TBDP: 2.0000 mg | ORAL_TABLET | Freq: Three times a day (TID) | ORAL | Status: DC | PRN

## 2020-02-29 NOTE — ED Triage Notes (Addendum)
Patient accompanied by stepfather. Stepfather states patient recently finished steroids for allergic reaction and now for past 3 days has paranoia. Stepfather states that he and patient were going to leave the house, stepfather went back into house to grab something, patient ran in behind him and said it was scary outside in the car and people were trying to get him. Patient denies SI/HI

## 2020-02-29 NOTE — BH Assessment (Signed)
Tele Assessment Note   Patient Name: Jackson Williams MRN: [id number] Referring Physician: Dr. Chaney Malling, MD Location of Patient: [facility] ED Location of Provider: Behavioral Health TTS Department  Darl Mcshan is a 24 y.o. male who was voluntarily brought to [facility] by his step-father due to experiencing paranoia for the last 2-3 days. Pt's step-father reported pt expressed to him that he was scared people were trying to kill him. An IV was started on pt, and he ripped it out, stating that the hospital was trying to kill him with the IV fluids. Pt was at the hospital two weeks prior with a bee sting; he was given 5 days of prednisone and reports he completed the steroids on 02/22/2020 (last week). Pt states, "the bee stung my left arm. I haven't eaten all day. [I'm here] to check to see if the drug they put into my arm is good or bad. I feel tired, scared, and paranoid for 2-3 days." There is question as to whether pt's paranoia is connected to the prednisone he was prescribed.  Pt endorses current SI, though he denies having a current plan. He denies experiencing SI in the past or any prior plans to attempt to kill himself. He has never been hospitalized for mental health concerns. Pt denies HI, AVH, NSSIB, access to guns/knives (his step-father confirms this), and engagement in the legal system. Pt shares he engages in the use of 3-4 alcoholic beverages approximately 2x/week.  Pt's protective factors include no mental health history, family support, and consistent work and housing.  Pt gave verbal consent for his step-father to remain in the room throughout the entirety of the [facility] Assessment.  Pt is oriented x4. His recent and remote memory is intact. Pt was cooperative throughout the assessment process, though he appeared confused at times. Pt's insight and insight is fair; his impulse control is poor.   Diagnosis: F29, Unspecified psychotic disorder    Past Medical History: No past  medical history on file.  No past surgical history on file.  Family History: No family history on file.  Social History:  reports that he has never smoked. He has never used smokeless tobacco. He reports that he does not drink alcohol or use drugs.  Additional Social History:  Alcohol / Drug Use Pain Medications: Please see MAR Prescriptions: Please see MAR Over the Counter: Please see MAR History of alcohol / drug use?: Yes Longest period of sobriety (when/how long): Unknown Substance #1 Name of Substance 1: EtOH 1 - Age of First Use: Unknown 1 - Amount (size/oz): 3-4 beers 1 - Frequency: Approx 2x/week 1 - Duration: Unknown 1 - Last Use / Amount: Unsure  CIWA: CIWA-Ar BP: (!) 156/102 Pulse Rate: 88 COWS:    Allergies:  Allergies  Allergen Reactions  . Other Other (See Comments)    Shell fish : Swelling.    Home Medications: (Not in a hospital admission)   OB/GYN Status:  No LMP for male patient.  General Assessment Data Location of Assessment: [facility] ED TTS Assessment: In system Is this a Tele or Face-to-Face Assessment?: Tele Assessment Is this an Initial Assessment or a Re-assessment for this encounter?: Initial Assessment Patient Accompanied by:: Adult(Jeff [name], step-father: [phone]) Permission Given to speak with another: Yes Name, Relationship and Phone Number: [name], step-father: [phone] Language Other than English: Yes Living Arrangements: Other (Comment)(Pt lives w/ family) What gender do you identify as?: Male Date Telepsych consult ordered in [facility]: 02/29/20 Time Telepsych consult ordered in [facility]:  1806 Marital status: Single Living Arrangements: Parent Can pt return to current living arrangement?: Yes Admission Status: Voluntary Is patient capable of signing voluntary admission?: Yes Referral Source: Self/Family/Friend Insurance type: Self-Pay     Crisis Care Plan Living Arrangements: Parent Legal Guardian: Other:(Self) Name  of Psychiatrist: None Name of Therapist: None  Education Status Is patient currently in school?: No Is the patient employed, unemployed or receiving disability?: Employed  Risk to self with the past 6 months Suicidal Ideation: Yes-Currently Present Has patient been a risk to self within the past 6 months prior to admission? : No Suicidal Intent: No Has patient had any suicidal intent within the past 6 months prior to admission? : No Is patient at risk for suicide?: No Suicidal Plan?: No Has patient had any suicidal plan within the past 6 months prior to admission? : No Access to Means: No What has been your use of drugs/alcohol within the last 12 months?: Pt acknowledges EtOH use Previous Attempts/Gestures: No How many times?: 0 Other Self Harm Risks: Pt is experiencing delusions, paranoia Triggers for Past Attempts: None known Intentional Self Injurious Behavior: None Family Suicide History: No Recent stressful life event(s): Other (Comment)(Stung by bee 2 weeks ago, had allergic reaction) Persecutory voices/beliefs?: No Depression: No Depression Symptoms: Despondent, Insomnia, Fatigue Substance abuse history and/or treatment for substance abuse?: No Suicide prevention information given to non-admitted patients: Not applicable  Risk to Others within the past 6 months Homicidal Ideation: No Does patient have any lifetime risk of violence toward others beyond the six months prior to admission? : No Thoughts of Harm to Others: No Current Homicidal Intent: No Current Homicidal Plan: No Access to Homicidal Means: No Identified Victim: None noted History of harm to others?: No Assessment of Violence: None Noted Violent Behavior Description: None noted Does patient have access to weapons?: No(Pt denies access to guns/weapons other than kitchen knives) Criminal Charges Pending?: No Does patient have a court date: No Is patient on probation?: No  Psychosis Hallucinations: None  noted Delusions: Persecutory  Mental Status Report Appearance/Hygiene: Unremarkable Eye Contact: Good Motor Activity: Unremarkable Speech: Slow, Other (Comment)(Pt pauses prior to answering many questions) Level of Consciousness: Alert Mood: Anxious Affect: Appropriate to circumstance Anxiety Level: Moderate Thought Processes: Coherent Judgement: Partial Orientation: Person, Place, Time, Situation Obsessive Compulsive Thoughts/Behaviors: Minimal  Cognitive Functioning Concentration: Normal Memory: Recent Intact, Remote Intact Is patient IDD: No Insight: Fair Impulse Control: Poor Appetite: Fair Have you had any weight changes? : No Change Sleep: Decreased Total Hours of Sleep: 4 Vegetative Symptoms: None  ADLScreening [facility] Assessment Services) Patient's cognitive ability adequate to safely complete daily activities?: Yes Patient able to express need for assistance with ADLs?: Yes Independently performs ADLs?: Yes (appropriate for developmental age)  Prior Inpatient Therapy Prior Inpatient Therapy: No  Prior Outpatient Therapy Prior Outpatient Therapy: No Does patient have an ACCT team?: No Does patient have Intensive In-House Services?  : No Does patient have [facility] services? : No Does patient have P4CC services?: No  ADL Screening (condition at time of admission) Patient's cognitive ability adequate to safely complete daily activities?: Yes Is the patient deaf or have difficulty hearing?: No Does the patient have difficulty seeing, even when wearing glasses/contacts?: No Does the patient have difficulty concentrating, remembering, or making decisions?: No Patient able to express need for assistance with ADLs?: Yes Does the patient have difficulty dressing or bathing?: No Independently performs ADLs?: Yes (appropriate for developmental age) Does the patient have difficulty walking or climbing  stairs?: No Weakness of Legs: None Weakness of Arms/Hands:  None  Home Assistive Devices/Equipment Home Assistive Devices/Equipment: None  Therapy Consults (therapy consults require a physician order) PT Evaluation Needed: No OT Evalulation Needed: No SLP Evaluation Needed: No Abuse/Neglect Assessment (Assessment to be complete while patient is alone) Abuse/Neglect Assessment Can Be Completed: Yes Physical Abuse: Denies Verbal Abuse: Denies Sexual Abuse: Denies Exploitation of patient/patient's resources: Denies Self-Neglect: Denies Values / Beliefs Cultural Requests During Hospitalization: None Spiritual Requests During Hospitalization: None Consults Spiritual Care Consult Needed: No Transition of Care Team Consult Needed: No Advance Directives (For Healthcare) Does Patient Have a Medical Advance Directive?: No Would patient like information on creating a medical advance directive?: No - Patient declined          Disposition: Nira Conn, NP, reviewed pt's chart and information and determined pt should be observed overnight for safety and stability and re-assessed in the morning by psychiatry. This information was provided to pt's providers at 2042. Pt and his step-father determined that pt felt comfortable returning home and agreed to return to the ED should the need arise. Pt was d/c with information re: otpt services.   Disposition Initial Assessment Completed for this Encounter: Yes Patient referred to: Other (Comment)(Pt should be observed overnight for safety and stability)  This service was provided via telemedicine using a 2-way, interactive audio and video technology.  Names of all persons participating in this telemedicine service and their role in this encounter. Name: Khayman Geske Role: Patient  Name: [name] Role: Patient's Step-Father  Name: Nira Conn Role: Nurse Practitioner  Name: Kerman Passey Role: Clinician    Ralph Dowdy 02/29/2020 9:07 PM

## 2020-02-29 NOTE — Discharge Instructions (Signed)
Please use resources below to find a psychiatric specialist for further evaluation of your condition.  Take vistaril as needed for anxiety.  Return if your symptoms worsen or if you have other concerns.

## 2020-02-29 NOTE — ED Notes (Signed)
Pt ripped out IV stating that we were trying to kill him with the IV fluids. Pt refused to let RN start another IV.

## 2020-02-29 NOTE — ED Provider Notes (Signed)
[facility]-EMERGENCY DEPT Provider Note   CSN: [id number] Arrival date & time: 02/29/20  1648     History Chief Complaint  Patient presents with  . Paranoid    Jackson Williams is a 24 y.o. male.  The history is provided by the patient and a parent. No language interpreter was used.     24 year old male without significant past medical history presenting accompanied by stepfather for complaints of paranoia.  Patient recently finished course of steroids for allergic reaction and for the past 3 days he has been exhibiting paranoia.  The majority of history was obtained through stepfather who is at bedside.  For the past 2 to 3 days patient has become increasingly more paranoid.  He is expressing that he fears that people are trying to get him.  He became upset when he watched TV about good control.  He has been more jumpy and more forgetful than usual.  He is having difficulty sleeping.  Patient also admits that he is hearing voices but unable to elaborate more.  He denies facial hallucination.  He did report feeling depressed but unable to tell me why.  Denies SI or HI.  No known family history of psychiatric illness.  Patient denies alcohol or tobacco use.  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  It was reported that patient developed an allergic reaction from bee stings 2 weeks ago and was seen at urgent care.  He was giving IV steroid and subsequently discharged with 5 days course of steroid and Benadryl.  He has not had these medication for about a week.  Patient currently denies having any active pain.  No past medical history on file.  There are no problems to display for this patient.   No past surgical history on file.     No family history on file.  Social History   Tobacco Use  . Smoking status: Never Smoker  . Smokeless tobacco: Never Used  Substance Use Topics  . Alcohol use: No  .  Drug use: No    Home Medications Prior to Admission medications   Medication Sig Start Date End Date Taking? Authorizing Provider  diphenhydrAMINE HCl (BENADRYL ALLERGY PO) Take 2 tablets by mouth daily as needed (for allergy, itching).    [provider]  EPINEPHrine 0.3 mg/0.3 mL IJ SOAJ injection Inject 0.3 mLs (0.3 mg total) into the muscle as needed for anaphylaxis. 02/17/20   Liberty Handy, PA-C  silver sulfADIAZINE (SILVADENE) 1 % cream Apply 1 application topically daily. Patient not taking: Reported on 12/29/2017 12/07/17   Wallis Bamberg, PA-C    Allergies    Other  Review of Systems   Review of Systems  All other systems reviewed and are negative.   Physical Exam Updated Vital Signs Pulse 77   Temp 98.6 F (37 C) (Oral)   Resp 15   Ht 5\' 7"  (1.702 m)   Wt 77.1 kg   SpO2 97%   BMI 26.63 kg/m   Physical Exam Vitals and nursing note reviewed.  Constitutional:      General: He is not in acute distress.    Appearance: He is well-developed.     Comments: Patient sitting in the bed in no acute discomfort.  HENT:     Head: Atraumatic.  Eyes:     Conjunctiva/sclera: Conjunctivae normal.  Cardiovascular:     Rate and Rhythm: Normal rate and regular  rhythm.     Pulses: Normal pulses.     Heart sounds: Normal heart sounds.  Pulmonary:     Effort: Pulmonary effort is normal.     Breath sounds: Normal breath sounds.  Abdominal:     Palpations: Abdomen is soft.     Tenderness: There is no abdominal tenderness.  Musculoskeletal:     Cervical back: Neck supple.  Skin:    Findings: No rash.  Neurological:     Mental Status: He is alert and oriented to person, place, and time. He is confused.     GCS: GCS eye subscore is 4. GCS verbal subscore is 5. GCS motor subscore is 6.     Cranial Nerves: Cranial nerves are intact.     Sensory: Sensation is intact.     Motor: Motor function is intact.  Psychiatric:        Mood and Affect: Mood is anxious.         Speech: Speech is delayed.        Behavior: Behavior is cooperative.        Thought Content: Thought content is paranoid. Thought content does not include homicidal or suicidal ideation.     ED Results / Procedures / Treatments   Labs (all labs ordered are listed, but only abnormal results are displayed) Labs Reviewed  COMPREHENSIVE METABOLIC PANEL - Abnormal; Notable for the following components:      Result Value   Total Protein 8.2 (*)    All other components within normal limits  CBC WITH DIFFERENTIAL/PLATELET - Abnormal; Notable for the following components:   Abs Immature Granulocytes 0.10 (*)    All other components within normal limits  SARS CORONAVIRUS 2 BY RT PCR (HOSPITAL ORDER, PERFORMED IN [facility])  RAPID URINE DRUG SCREEN, HOSP PERFORMED  ETHANOL    EKG None  Radiology No results found.  Procedures Procedures (including critical care time)  Medications Ordered in ED Medications  risperiDONE (RISPERDAL M-TABS) disintegrating tablet 2 mg (has no administration in time range)    And  LORazepam (ATIVAN) tablet 1 mg (has no administration in time range)    And  ziprasidone (GEODON) injection 20 mg (has no administration in time range)  acetaminophen (TYLENOL) tablet 650 mg (has no administration in time range)  zolpidem (AMBIEN) tablet 5 mg (has no administration in time range)  alum & mag hydroxide-simeth (MAALOX/MYLANTA) 200-200-20 MG/5ML suspension 30 mL (has no administration in time range)  sodium chloride 0.9 % bolus 1,000 mL (0 mLs Intravenous Paused 02/29/20 1820)    ED Course  I have reviewed the triage vital signs and the nursing notes.  Pertinent labs & imaging results that were available during my care of the patient were reviewed by me and considered in my medical decision making (see chart for details).    MDM Rules/Calculators/A&P                      BP (!) 156/102   Pulse 88   Temp 98.6 F (37 C) (Oral)   Resp 16   Ht  5\' 7"  (1.702 m)   Wt 77.1 kg   SpO2 99%   BMI 26.63 kg/m   Final Clinical Impression(s) / ED Diagnoses Final diagnoses:  Paranoia (HCC)    Rx / DC Orders ED Discharge Orders         Ordered    hydrOXYzine (ATARAX/VISTARIL) 25 MG tablet  Every 6 hours PRN  02/29/20 2058         6:03 PM Patient brought here due to paranoia for the past 2 to 3 days.  Stepfather mentioned that he has been expressed that he is scared that people about to get him.  Patient states he is depressed and anxious but unsure why.  Does admits to auditory hallucinations.  Denies SI or HI.  Previously was treated with prednisone sent Benadryl for allergic reaction to bee sting but last prednisone dose was approximately a week ago.  At this time I have low suspicion for glucocorticoid induced psychosis.  We will consult TTS for further evaluation.  8:02 PM  IV fluids started however patient request for IV access to be removed due to being paranoid of what's going into the IV.  He is medically cleared for further psychiatric assessment.   8:55 PM Patient was screened bedside, felt he could be obs overnight for psychiatry evaluation tomorrow.  However, at this time patient request to be discharged.  After further clarification it sounds like he is not really having auditory hallucination.  He is hearing loud birds chirping outside of the window instead of hearing voices.  I discussed options with patient and family member, they preferred outpatient evaluation.  Patient understands he can return if his condition worsen.  Will discharge home with resources.   Domenic Moras, PA-C 02/29/20 2100    Drenda Freeze, MD 03/01/20 [phone]

## 2020-03-02 ENCOUNTER — Other Ambulatory Visit: Payer: Self-pay

## 2020-03-02 ENCOUNTER — Emergency Department (HOSPITAL_COMMUNITY): Payer: Self-pay

## 2020-03-02 ENCOUNTER — Emergency Department (HOSPITAL_COMMUNITY)
Admission: EM | Admit: 2020-03-02 | Discharge: 2020-03-03 | Disposition: A | Discharge status: Other Acute Inpatient Hospital | Payer: Self-pay | Attending: Emergency Medicine | Admitting: Emergency Medicine

## 2020-03-02 ENCOUNTER — Encounter (HOSPITAL_COMMUNITY): Payer: Self-pay

## 2020-03-02 ENCOUNTER — Ambulatory Visit (HOSPITAL_COMMUNITY)
Admission: AD | Admit: 2020-03-02 | Discharge: 2020-03-02 | Disposition: A | Discharge status: Home / Self Care | Payer: Self-pay | Attending: Psychiatry | Admitting: Psychiatry

## 2020-03-02 DIAGNOSIS — R451 Restlessness and agitation: Secondary | ICD-10-CM | POA: Insufficient documentation

## 2020-03-02 DIAGNOSIS — F29 Unspecified psychosis not due to a substance or known physiological condition: Secondary | ICD-10-CM | POA: Insufficient documentation

## 2020-03-02 DIAGNOSIS — Z20822 Contact with and (suspected) exposure to covid-19: Secondary | ICD-10-CM | POA: Insufficient documentation

## 2020-03-02 DIAGNOSIS — F22 Delusional disorders: Secondary | ICD-10-CM | POA: Insufficient documentation

## 2020-03-02 LAB — CBC
HCT: 50.9 % (ref 39.0–52.0)
Hemoglobin: 16.7 g/dL (ref 13.0–17.0)
MCH: 29.6 pg (ref 26.0–34.0)
MCHC: 32.8 g/dL (ref 30.0–36.0)
MCV: 90.2 fL (ref 80.0–100.0)
Platelets: 317 10*3/uL (ref 150–400)
RBC: 5.64 MIL/uL (ref 4.22–5.81)
RDW: 14 % (ref 11.5–15.5)
WBC: 10.5 10*3/uL (ref 4.0–10.5)
nRBC: 0 % (ref 0.0–0.2)

## 2020-03-02 LAB — COMPREHENSIVE METABOLIC PANEL
ALT: 18 U/L (ref 0–44)
AST: 18 U/L (ref 15–41)
Albumin: 4.5 g/dL (ref 3.5–5.0)
Alkaline Phosphatase: 68 U/L (ref 38–126)
Anion gap: 7 (ref 5–15)
BUN: 18 mg/dL (ref 6–20)
CO2: 27 mmol/L (ref 22–32)
Calcium: 9.4 mg/dL (ref 8.9–10.3)
Chloride: 104 mmol/L (ref 98–111)
Creatinine, Ser: 1.24 mg/dL (ref 0.61–1.24)
GFR calc Af Amer: >60 mL/min (ref >60)
GFR calc non Af Amer: >60 mL/min (ref >60)
Glucose, Bld: 98 mg/dL (ref 70–99)
Potassium: 4 mmol/L (ref 3.5–5.1)
Sodium: 138 mmol/L (ref 135–145)
Total Bilirubin: 1 mg/dL (ref 0.3–1.2)
Total Protein: 7.6 g/dL (ref 6.5–8.1)

## 2020-03-02 LAB — ETHANOL: Alcohol, Ethyl (B): <10 mg/dL (ref <10)

## 2020-03-02 LAB — RAPID URINE DRUG SCREEN, HOSP PERFORMED
Amphetamines: NOT DETECTED
Barbiturates: NOT DETECTED
Benzodiazepines: NOT DETECTED
Cocaine: NOT DETECTED
Opiates: NOT DETECTED
Tetrahydrocannabinol: NOT DETECTED

## 2020-03-02 LAB — TSH: TSH: 1.301 u[IU]/mL (ref 0.350–4.500)

## 2020-03-02 LAB — ACETAMINOPHEN LEVEL: Acetaminophen (Tylenol), Serum: <10 ug/mL — ABNORMAL LOW (ref 10–30)

## 2020-03-02 LAB — SALICYLATE LEVEL: Salicylate Lvl: <7 mg/dL — ABNORMAL LOW (ref 7.0–30.0)

## 2020-03-02 LAB — SARS CORONAVIRUS 2 BY RT PCR (HOSPITAL ORDER, PERFORMED IN ~~LOC~~ HOSPITAL LAB): SARS Coronavirus 2: NEGATIVE

## 2020-03-02 MED ORDER — ZIPRASIDONE MESYLATE 20 MG IM SOLR
20.0000 mg | Freq: Once | INTRAMUSCULAR | Status: AC
  Administered 2020-03-02: 20 mg via INTRAMUSCULAR
  Filled 2020-03-02: qty 20

## 2020-03-02 MED ORDER — LORAZEPAM 2 MG/ML IJ SOLN
2.0000 mg | Freq: Once | INTRAMUSCULAR | Status: DC
  Filled 2020-03-02: qty 1

## 2020-03-02 MED ORDER — OLANZAPINE 5 MG PO TABS
5.0000 mg | ORAL_TABLET | Freq: Every day | ORAL | Status: DC
  Administered 2020-03-02: 5 mg via ORAL
  Filled 2020-03-02: qty 1

## 2020-03-02 MED ORDER — HALOPERIDOL LACTATE 5 MG/ML IJ SOLN
5.0000 mg | Freq: Once | INTRAMUSCULAR | Status: AC
  Administered 2020-03-02: 5 mg via INTRAMUSCULAR
  Filled 2020-03-02: qty 1

## 2020-03-02 MED ORDER — ZOLPIDEM TARTRATE 5 MG PO TABS: 5.0000 mg | ORAL_TABLET | Freq: Every evening | ORAL | Status: DC | PRN

## 2020-03-02 MED ORDER — ACETAMINOPHEN 325 MG PO TABS: 650.0000 mg | ORAL_TABLET | ORAL | Status: DC | PRN

## 2020-03-02 MED ORDER — ONDANSETRON HCL 4 MG PO TABS
4.0000 mg | ORAL_TABLET | Freq: Three times a day (TID) | ORAL | Status: DC | PRN
  Administered 2020-03-03: 4 mg via ORAL
  Filled 2020-03-02: qty 1

## 2020-03-02 MED ORDER — LORAZEPAM 2 MG/ML IJ SOLN
2.0000 mg | Freq: Once | INTRAMUSCULAR | Status: AC
  Administered 2020-03-02: 2 mg via INTRAMUSCULAR

## 2020-03-02 MED ORDER — STERILE WATER FOR INJECTION IJ SOLN
INTRAMUSCULAR | Status: AC
  Filled 2020-03-02: qty 10

## 2020-03-02 NOTE — ED Notes (Signed)
Pt standing bedside, trying to leave. Security bedside.

## 2020-03-02 NOTE — ED Notes (Signed)
Pt was being transported to CT for CT scan when pt attempted to jump out of stretcher and run out the EMS bay. Security was able to bring pt back to stretcher and back to [place] [facility] bed. Pt attempting to leave. Per Dr. Lynelle Doctor pt is IVC. Pt still attempting to leave, security is bedside.

## 2020-03-02 NOTE — ED Triage Notes (Signed)
Patient is accompanied by stepfather. Patient was at [facility] today and would not talk according to the stepfather. Stepfather also reports that the patient's BP was high. BP-142/101 in triage. Stepfather reports that [facility] told them to come to the ED for psych evaluation.  Patient is sitting in wheelchair and will not open his eyes or talk.  Patient's stepfather reports that the patient was agitated and paranoid this AM

## 2020-03-02 NOTE — BHH Counselor (Addendum)
Patient presents to [facility] for assessment with his stepfather, [name]. Patient is unable to stand, talk, and tech is unable to get vitals. Fransisca Kaufmann, PMHNP assessed patient and determined he should go to [facility] ED due to acute medical concerns. OBS RN notified.

## 2020-03-02 NOTE — ED Notes (Signed)
Pt not cooperating with staff, pulling off pulse oximetry reader and blood pressure cuff. Pt calming down when no one is touching him. TSH blood draw ordered, will wait to let patient finish calming down before asking to draw it. Will continue to monitor. Safety sitter bedside.

## 2020-03-02 NOTE — BH Assessment (Addendum)
Assessment Note  Jackson Williams is an 24 y.o. male that presents this date with his step father Jackson Williams [phone]. Patient was observed to be paranoid and agitated on arrival. Patient was attempting to elope from ED and a IVC was initiated by provider. Patient was medicated and was observed to be drowsy at the time of assessment. Patient was asleep at the time of assessment with father at bedside. Patient could not participate in the assessment although this writer returned later with patient rendering limited information. Patient is speaking in a low soft voice that is incoherent at times. Patient is making statements unrelated to this writer's questions. Patient did nod his head no to S/I, H/I and AVH although this writer is uncertain if patient is comprehending the content of this writers's questions. Patient would not respond to any further questions. Information to complete assessment was obtained from admission notes and step father who was at bedside earlier providing collateral. Step father Jackson Williams states patient was seen and assessed on 02/29/20 when he presented with similar symptoms. Per that note stepfather states patient recently finished steroids for allergic reaction and now (02/29/20) for past 3 days has paranoia. Stepfather states that he and patient were going to leave the house, stepfather went back into house to grab something, patient ran in behind him and said it was scary outside in the car and people were trying to get him. Patient denies SI/HI at that time. Patient was observed and monitored at that time and later discharged. Jackson Williams states patient returned home and continued to exhibit signs of paranoia that gradually worsened until patient started becoming aggressive and disorganized today which prompted step father to bring patient back to ED.   Per note review as of this date. Jackson Doctor MD writes. "Patient presented to the ED for evaluation of mental status/psychiatric  changes. Symptoms started a short time after the patient was given a prescription for steroids for a bee sting. However the patient finished those medications several days prior to the initiation of the symptoms. Patient initially was seen in the emergency room on June 5. At that time he was having primarily paranoia. Patient indicated he was hearing voices possibly. He was denying suicidal or homicidal ideation. Patient was very scared and anxious. His stepfather was with him that evening. Patient had a medical evaluation that was reassuring. The plan was to consult psychiatry but ultimately the family felt comfortable going home and were planning to follow-up as an outpatient. Patient seemed to be doing better for a day or so and then the symptoms returned. Patient has been trying to get out of the house. He is paranoid. Family took him to mental health to be evaluated but then when he got there he also on stopped communicating. Patient will not speak to me. He will not follow any commands.  Per step father and note review patient does not have a mental health history. Patient has never attempted self harm or uses any illicit substances with UDS pending this date. Patient's protective factors include no mental health history, family support, and consistent work and housing. Patient was unable to provide history or answer any questions associated with assessment beyond above noted.   Case was staffed with [facility]in NP who recommended a inpatient admission.       Diagnosis: Unspecified psychosis   Past Medical History: History reviewed. No pertinent past medical history.  History reviewed. No pertinent surgical history.  Family History: History reviewed. No pertinent family history.  Social History:  reports that he has never smoked. He has never used smokeless tobacco. He reports that he does not drink alcohol or use drugs.  Additional Social History:  Alcohol / Drug Use Pain Medications: See  MAR Prescriptions: See MAR Over the Counter: See MAR History of alcohol / drug use?: No history of alcohol / drug abuse  CIWA: CIWA-Ar BP: 137/77 Pulse Rate: 88 COWS:    Allergies:  Allergies  Allergen Reactions  . Other Other (See Comments)    Shell fish : Swelling.    Home Medications: (Not in a hospital admission)   OB/GYN Status:  No LMP for male patient.  General Assessment Data Location of Assessment: [facility] ED TTS Assessment: In system Is this a Tele or Face-to-Face Assessment?: Face-to-Face Is this an Initial Assessment or a Re-assessment for this encounter?: Initial Assessment Patient Accompanied by:: Other(Step father) Permission Given to speak with another: (UTA) Name, Relationship and Phone Number: Jackson Williams [phone] Language Other than English: No Living Arrangements: Other (Comment)(Pt lives with family) What gender do you identify as?: Male Date Telepsych consult ordered in [facility]: 03/02/20 Time Telepsych consult ordered in [facility]: 1558 Marital status: Single Living Arrangements: Parent Can pt return to current living arrangement?: Yes Admission Status: Involuntary Petitioner: ED Attending Is patient capable of signing voluntary admission?: Yes Referral Source: Self/Family/Friend Insurance type: SP     Crisis Care Plan Living Arrangements: Parent Legal Guardian: (Self) Name of Psychiatrist: None Name of Therapist: None  Education Status Is patient currently in school?: No Is the patient employed, unemployed or receiving disability?: Employed  Risk to self with the past 6 months Suicidal Ideation: No Has patient been a risk to self within the past 6 months prior to admission? : No Suicidal Intent: No Has patient had any suicidal intent within the past 6 months prior to admission? : No Is patient at risk for suicide?: No Suicidal Plan?: No Has patient had any suicidal plan within the past 6 months prior to admission? : No Access to Means:  No What has been your use of drugs/alcohol within the last 12 months?: Denies Previous Attempts/Gestures: No How many times?: 0 Other Self Harm Risks: (Recent onset of psych symptoms) Triggers for Past Attempts: (NA) Intentional Self Injurious Behavior: None Family Suicide History: No Recent stressful life event(s): (Recent onset of psych symptoms) Persecutory voices/beliefs?: No Depression: No Depression Symptoms: (Denies) Substance abuse history and/or treatment for substance abuse?: No Suicide prevention information given to non-admitted patients: Not applicable  Risk to Others within the past 6 months Homicidal Ideation: No Does patient have any lifetime risk of violence toward others beyond the six months prior to admission? : No Thoughts of Harm to Others: No Current Homicidal Intent: No Current Homicidal Plan: No Access to Homicidal Means: No Identified Victim: None noted History of harm to others?: No Assessment of Violence: On admission Violent Behavior Description: Attempting to pull out lines, aggression with staff  Does patient have access to weapons?: No Criminal Charges Pending?: No Does patient have a court date: No Is patient on probation?: No  Psychosis Hallucinations: None noted Delusions: Persecutory  Mental Status Report Appearance/Hygiene: Unremarkable Eye Contact: Poor Motor Activity: Agitation, Unsteady Speech: Incoherent, Slow Level of Consciousness: Drowsy Mood: Anxious Affect: Appropriate to circumstance Anxiety Level: Moderate Thought Processes: Unable to Assess Judgement: Unable to Assess Orientation: Unable to assess Obsessive Compulsive Thoughts/Behaviors: Unable to Assess  Cognitive Functioning Concentration: Unable to Assess Memory: Unable to Assess Is patient IDD: No Insight:  Unable to Assess Impulse Control: Unable to Assess Appetite: Good(per father good ) Have you had any weight changes? : No Change Sleep: Decreased Total  Hours of Sleep: 3 Vegetative Symptoms: None  ADLScreening [facility] Assessment Services) Patient's cognitive ability adequate to safely complete daily activities?: Yes Patient able to express need for assistance with ADLs?: Yes Independently performs ADLs?: Yes (appropriate for developmental age)  Prior Inpatient Therapy Prior Inpatient Therapy: No  Prior Outpatient Therapy Prior Outpatient Therapy: No Does patient have an ACCT team?: No Does patient have Intensive In-House Services?  : No Does patient have [facility] services? : No Does patient have P4CC services?: No  ADL Screening (condition at time of admission) Patient's cognitive ability adequate to safely complete daily activities?: Yes Is the patient deaf or have difficulty hearing?: No Does the patient have difficulty seeing, even when wearing glasses/contacts?: No Does the patient have difficulty concentrating, remembering, or making decisions?: No Patient able to express need for assistance with ADLs?: Yes Does the patient have difficulty dressing or bathing?: No Independently performs ADLs?: Yes (appropriate for developmental age) Does the patient have difficulty walking or climbing stairs?: No Weakness of Legs: None Weakness of Arms/Hands: None  Home Assistive Devices/Equipment Home Assistive Devices/Equipment: None  Therapy Consults (therapy consults require a physician order) PT Evaluation Needed: No OT Evalulation Needed: No SLP Evaluation Needed: No Abuse/Neglect Assessment (Assessment to be complete while patient is alone) Physical Abuse: Denies Verbal Abuse: Denies Sexual Abuse: Denies Exploitation of patient/patient's resources: Denies Self-Neglect: Denies Values / Beliefs Cultural Requests During Hospitalization: None Spiritual Requests During Hospitalization: None Consults Spiritual Care Consult Needed: No Transition of Care Team Consult Needed: No Advance Directives (For Healthcare) Does Patient Have a  Medical Advance Directive?: No Would patient like information on creating a medical advance directive?: No - Patient declined          Disposition:Case was staffed with Rankin NP who recommended a inpatient admission.  Disposition Initial Assessment Completed for this Encounter: Yes  On Site Evaluation by:   Reviewed with Physician:    Mamie Nick 03/02/2020 5:13 PM

## 2020-03-02 NOTE — ED Notes (Addendum)
Pt stepfather bedside with Recruitment consultant. Pt more calm now. Pt given sandwich and PO fluids. Pt not able to give urine sample at this time.

## 2020-03-02 NOTE — Progress Notes (Signed)
Patient woke up at approximately 2215 calm and cooperative. Mellody Dance, NT and I spoke with patient via audio interpreter via [employer]. We asked if he had any questions and let him know that he would be staying here overnight until doctor re-assessed him in the am. We offered him food and showed him his bathroom and call light. He asked for sandwich, drink and peanut butter and crackers. He stated that he understood the plan and ate the food offered to him. Currently he is resting comfortably in bed.

## 2020-03-02 NOTE — ED Notes (Signed)
On admission to the TCU pt is erratic,, poor comprehension, not redirectable, trying to run out of here.  Family member, [name] (Step-father) is a calming presence somewhat for pt and allowed to be present. Pt had IM medication which took hands on and also put in four point restraints.

## 2020-03-02 NOTE — ED Provider Notes (Signed)
[facility]-EMERGENCY DEPT Provider Note   CSN: [id number] Arrival date & time: 03/02/20  1156     History Chief Complaint  Patient presents with  . Psychiatric Evaluation  . Hypertension    Jackson Williams is a 24 y.o. male.  HPI   Patient presented to the ED for evaluation of mental status/psychiatric changes.  Symptoms started a short time after the patient was given a prescription for steroids for a bee sting.  However the patient finished those medications several days prior to the initiation of the symptoms.  Patient initially was seen in the emergency room on June 5.  At that time he was having primarily paranoia.  Patient indicated he was hearing voices possibly.  He was denying suicidal or homicidal ideation.  Patient was very scared and anxious.  His stepfather was with him that evening.  Patient had a medical evaluation that was reassuring.  The plan was to consult psychiatry but ultimately the family felt comfortable going home and were planning to follow-up as an outpatient.  Patient seemed to be doing better for a day or so and then the symptoms returned.  Patient has been trying to get out of the house.  He is paranoid.  Family took him to mental health to be evaluated but then when he got there he also on stopped communicating.  Patient will not speak to me.  He will not follow any commands.  History reviewed. No pertinent past medical history.  There are no problems to display for this patient.   History reviewed. No pertinent surgical history.     History reviewed. No pertinent family history.  Social History   Tobacco Use  . Smoking status: Never Smoker  . Smokeless tobacco: Never Used  Substance Use Topics  . Alcohol use: No  . Drug use: No    Home Medications Prior to Admission medications   Medication Sig Start Date End Date Taking? Authorizing Provider  diphenhydrAMINE HCl (BENADRYL ALLERGY PO) Take 1 tablet by mouth daily as  needed (for allergy, itching).     [provider]  diphenhydrAMINE-zinc acetate (BENADRYL) cream Apply 1 application topically 3 (three) times daily as needed for itching.    [provider]  EPINEPHrine 0.3 mg/0.3 mL IJ SOAJ injection Inject 0.3 mLs (0.3 mg total) into the muscle as needed for anaphylaxis. 02/17/20   Liberty Handy, PA-C  hydrOXYzine (ATARAX/VISTARIL) 25 MG tablet Take 1 tablet (25 mg total) by mouth every 6 (six) hours as needed for anxiety, itching or nausea. 02/29/20   Fayrene Helper, PA-C    Allergies    Other  Review of Systems   Review of Systems  All other systems reviewed and are negative.   Physical Exam Updated Vital Signs BP 137/77   Pulse 88   Temp 99.5 F (37.5 C) (Oral)   Resp 18   Ht 1.702 m (5\' 7" )   Wt 77.1 kg   SpO2 98%   BMI 26.63 kg/m   Physical Exam Vitals and nursing note reviewed.  Constitutional:      General: He is not in acute distress.    Appearance: He is well-developed.  HENT:     Head: Normocephalic and atraumatic.     Right Ear: External ear normal.     Left Ear: External ear normal.  Eyes:     General: No scleral icterus.       Right eye: No discharge.        Left  eye: No discharge.     Conjunctiva/sclera: Conjunctivae normal.  Neck:     Trachea: No tracheal deviation.  Cardiovascular:     Rate and Rhythm: Normal rate and regular rhythm.  Pulmonary:     Effort: Pulmonary effort is normal. No respiratory distress.     Breath sounds: Normal breath sounds. No stridor. No wheezing or rales.  Abdominal:     General: Bowel sounds are normal. There is no distension.     Palpations: Abdomen is soft.     Tenderness: There is no abdominal tenderness. There is no guarding or rebound.  Musculoskeletal:        General: No tenderness.     Cervical back: Neck supple.  Skin:    General: Skin is warm and dry.     Findings: No rash.  Neurological:     Mental Status: He is alert.     Cranial Nerves: Cranial  nerve deficit:       Motor: No abnormal muscle tone or seizure activity.     Comments: Patient will not answer any questions.  His eyes do flutter open and he seems to look at me briefly when I speak.  Patient will not move any extremities when I asked him to however when I lift his arms and legs off the bed he will slowly let them back down  Psychiatric:        Attention and Perception: He is inattentive.        Mood and Affect: Affect is blunt.        Speech: He is noncommunicative.        Behavior: Behavior is withdrawn.     ED Results / Procedures / Treatments   Labs (all labs ordered are listed, but only abnormal results are displayed) Labs Reviewed  SALICYLATE LEVEL - Abnormal; Notable for the following components:      Result Value   Salicylate Lvl <3.1 (*)    All other components within normal limits  ACETAMINOPHEN LEVEL - Abnormal; Notable for the following components:   Acetaminophen (Tylenol), Serum <10 (*)    All other components within normal limits  SARS CORONAVIRUS 2 BY RT PCR (HOSPITAL ORDER, [place] [facility])  COMPREHENSIVE METABOLIC PANEL  ETHANOL  CBC  RAPID URINE DRUG SCREEN, HOSP PERFORMED  TSH    EKG None  Radiology CT Head Wo Contrast  Result Date: 03/02/2020 CLINICAL DATA:  Mental status change, unknown cause. EXAM: CT HEAD WITHOUT CONTRAST TECHNIQUE: Contiguous axial images were obtained from the base of the skull through the vertex without intravenous contrast. COMPARISON:  No pertinent prior studies available for comparison. FINDINGS: Brain: There is no acute intracranial hemorrhage. No demarcated cortical infarct. No extra-axial fluid collection. No evidence of intracranial mass. No midline shift. Vascular: No hyperdense vessel. Skull: Normal. Negative for fracture or focal lesion. Sinuses/Orbits: Visualized orbits show no acute finding. No significant paranasal sinus disease or mastoid effusion at the imaged levels. IMPRESSION:  Unremarkable CT appearance of the brain. No evidence of acute intracranial abnormality. Electronically Signed   By: Kellie Simmering DO   On: 03/02/2020 15:29    Procedures Procedures (including critical care time)  Medications Ordered in ED Medications  zolpidem (AMBIEN) tablet 5 mg (has no administration in time range)  ondansetron (ZOFRAN) tablet 4 mg (has no administration in time range)  acetaminophen (TYLENOL) tablet 650 mg (has no administration in time range)  ziprasidone (GEODON) injection 20 mg (20 mg Intramuscular Given 03/02/20 1404)  sterile water (preservative free) injection (  Return to [facility] 03/02/20 1448)    ED Course  I have reviewed the triage vital signs and the nursing notes.  Pertinent labs & imaging results that were available during my care of the patient were reviewed by me and considered in my medical decision making (see chart for details).  Clinical Course as of Mar 02 1541  Mon Mar 02, 2020  1325 Altered mental status likely psychogenic in origin.  However will rule out metabolic causes.  CT scan also ordered   [JK]  1344 Patient now wandering and trying to get out of the ED.  I will he IVC him   [JK]    Clinical Course User Index [JK] Linwood Dibbles, MD   MDM Rules/Calculators/A&P                      Patient presented to ED with acute onset of behavior changes.  Symptoms are concerning for the possibility of acute psychosis.  Patient has been waxing and waning somewhat.  Initially he was more catatonic when I saw him but now he is awake and alert talking to his stepfather.  No signs of acute medical issues at this time.  CT scan does not show any acute abnormalities.  TSH is pending but overall he appears medically stable.  Will consult TTS to get their assessment. Final Clinical Impression(s) / ED Diagnoses Final diagnoses:  Psychosis, unspecified psychosis type [employer])    Rx / DC Orders ED Discharge Orders    None       Linwood Dibbles, MD 03/02/20  [phone]

## 2020-03-02 NOTE — ED Notes (Signed)
Medication effective, pt calm and resting in bed with eyes closed. Family at bedside.   PT WILL NEED THAI INTERPRETER FOR ASSESSMENTS. UNDERSTANDS AND SPEAKS SOME ENGLISH.

## 2020-03-02 NOTE — ED Notes (Signed)
Stepfather/Jeff [name]  [phone]

## 2020-03-03 ENCOUNTER — Encounter (HOSPITAL_COMMUNITY): Payer: Self-pay | Admitting: Psychiatry

## 2020-03-03 ENCOUNTER — Inpatient Hospital Stay (HOSPITAL_COMMUNITY)
Admission: AD | Admit: 2020-03-03 | Discharge: 2020-03-09 | DRG: 885 | Disposition: A | Discharge status: Home / Self Care | Payer: Federal, State, Local not specified - Other | Source: Intra-hospital | Attending: Psychiatry | Admitting: Psychiatry

## 2020-03-03 DIAGNOSIS — Z91013 Allergy to seafood: Secondary | ICD-10-CM

## 2020-03-03 DIAGNOSIS — E7800 Pure hypercholesterolemia, unspecified: Secondary | ICD-10-CM | POA: Diagnosis present

## 2020-03-03 DIAGNOSIS — F29 Unspecified psychosis not due to a substance or known physiological condition: Secondary | ICD-10-CM | POA: Diagnosis not present

## 2020-03-03 DIAGNOSIS — Z79899 Other long term (current) drug therapy: Secondary | ICD-10-CM | POA: Diagnosis not present

## 2020-03-03 DIAGNOSIS — F329 Major depressive disorder, single episode, unspecified: Secondary | ICD-10-CM | POA: Diagnosis present

## 2020-03-03 DIAGNOSIS — F23 Brief psychotic disorder: Principal | ICD-10-CM | POA: Diagnosis present

## 2020-03-03 DIAGNOSIS — F419 Anxiety disorder, unspecified: Secondary | ICD-10-CM | POA: Diagnosis present

## 2020-03-03 DIAGNOSIS — Z91030 Bee allergy status: Secondary | ICD-10-CM | POA: Diagnosis not present

## 2020-03-03 DIAGNOSIS — F940 Selective mutism: Secondary | ICD-10-CM | POA: Diagnosis present

## 2020-03-03 MED ORDER — LORAZEPAM 1 MG PO TABS: 1.0000 mg | ORAL_TABLET | ORAL | Status: DC | PRN

## 2020-03-03 MED ORDER — ZIPRASIDONE MESYLATE 20 MG IM SOLR: 20.0000 mg | INTRAMUSCULAR | Status: DC | PRN

## 2020-03-03 MED ORDER — OLANZAPINE 5 MG PO TBDP: 5.0000 mg | ORAL_TABLET | Freq: Three times a day (TID) | ORAL | Status: DC | PRN

## 2020-03-03 MED ORDER — OLANZAPINE 5 MG PO TABS
5.0000 mg | ORAL_TABLET | Freq: Every day | ORAL | Status: DC
  Administered 2020-03-03 – 2020-03-04 (×2): 5 mg via ORAL
  Filled 2020-03-03 (×3): qty 1
  Filled 2020-03-03: qty 2

## 2020-03-03 NOTE — Progress Notes (Signed)
This patient prefers to use interpretor services while meeting with staff. Patients preferred language is [place].    Ruthann Cancer MSW, [employer] Clincal Social Worker  [facility]

## 2020-03-03 NOTE — BHH Suicide Risk Assessment (Signed)
[facility] Admission Suicide Risk Assessment   Nursing information obtained from:  Patient Demographic factors:  Male, Adolescent or young adult Current Mental Status:  NA Loss Factors:  Decline in physical health Historical Factors:  NA Risk Reduction Factors:  Living with another person, especially a relative  Total Time spent with patient: 45 minutes Principal Problem: Psychosis, Unspecified , consider Steroid Induced Psychosis Diagnosis: Psychosis, Unspecified , consider Steroid Induced Psychosis Subjective Data:   Continued Clinical Symptoms:  Alcohol Use Disorder Identification Test Final Score (AUDIT): 0 The "Alcohol Use Disorders Identification Test", Guidelines for Use in Primary Care, Second Edition.  World Science writer [facility]). Score between 0-7:  no or low risk or alcohol related problems. Score between 8-15:  moderate risk of alcohol related problems. Score between 16-19:  high risk of alcohol related problems. Score 20 or above:  warrants further diagnostic evaluation for alcohol dependence and treatment.   CLINICAL FACTORS:  24 year old male, initially presented to ED on 6/5 for acute onset psychosis.  At the time appeared to improve in ED and was discharged back home.  Returned on 6/7 due to exacerbation of psychosis with recurrent paranoia, disorganized behavior and selective mutism.  As per parents, who provided collateral information patient has no prior psychiatric history and was in his usual state of health until last week.  He had developed a series of allergic reaction to a bee sting on 5/24 at which time he was given parenteral steroids and then p.o. steroid course.  Of note by the time he developed above symptoms he had already completed steroid course.  Patient endorses auditory hallucinations which he describes as voices carrying out conversations.  Denies alcohol or drug abuse and UDS/BAL are negative.  Of note head CT scan was negative.  Psychiatric Specialty  Exam: Physical Exam  Review of Systems  Blood pressure 119/90, pulse 84, temperature 98.6 F (37 C), temperature source Oral, resp. rate 18, height 5\' 7"  (1.702 m), weight 75.3 kg, SpO2 99 %.Body mass index is 26 kg/m.  See admit note MSE    COGNITIVE FEATURES THAT CONTRIBUTE TO RISK:  Closed-mindedness and Loss of executive function    SUICIDE RISK:   Moderate:  Frequent suicidal ideation with limited intensity, and duration, some specificity in terms of plans, no associated intent, good self-control, limited dysphoria/symptomatology, some risk factors present, and identifiable protective factors, including available and accessible social support.  PLAN OF CARE: Patient will be admitted to inpatient psychiatric unit for stabilization and safety. Will provide and encourage milieu participation. Provide medication management and maked adjustments as needed.  Will follow daily.    I certify that inpatient services furnished can reasonably be expected to improve the patient's condition.   , MD 03/03/2020, 5:27 PM

## 2020-03-03 NOTE — Tx Team (Signed)
Initial Treatment Plan 03/03/2020 12:31 PM [name] [id number]    PATIENT STRESSORS: Health problems Occupational concerns   PATIENT STRENGTHS: Ability for insight Average or above average intelligence Supportive family/friends   PATIENT IDENTIFIED PROBLEMS: Paranoia  Hallucinations  Anxiety                 DISCHARGE CRITERIA:  Ability to meet basic life and health needs Adequate post-discharge living arrangements Medical problems require only outpatient monitoring Safe-care adequate arrangements made  PRELIMINARY DISCHARGE PLAN: Attend aftercare/continuing care group Outpatient therapy Return to previous living arrangement  PATIENT/FAMILY INVOLVEMENT: This treatment plan has been presented to and reviewed with the patient, Jackson Williams, and/or family member.  The patient and family have been given the opportunity to ask questions and make suggestions.  Clarene Critchley, RN 03/03/2020, 12:31 PM

## 2020-03-03 NOTE — Progress Notes (Signed)
Admission Note: Patient is a 24 years old male admitted to the unit for symptoms of paranoia and hallucinations.  Patient is paranoid about people trying to kill him.  Per report, patient was stung by a bee last week and had allergic reaction.  Patient received Prednisone treatment. Family members noticed a change in behavior after receiving Prednisone.  Patient presents with a flat affect and depressed mood.  Appears drowsy, slightly sedated and soft spoken.  Admission plan of care reviewed and consent signed.  Skin assessment completed.  No contraband found.  Skin is dry and intact.  Patient was minimal and forward little information.  Routine safety checks initiated.  Patient oriented to the unit, staff and room.  Patient is safe on the unit.

## 2020-03-03 NOTE — BH Assessment (Addendum)
BHH Assessment Progress Note  Per Shuvon Rankin, FNP, this pt requires psychiatric hospitalization.  Pt has been assigned pt to [facility] Rm 504-1.  Pt presents under IVC initiated by EDP Linwood Dibbles, MD, and IVC documents have been faxed to [facility].  Pt's nurse, Kendal Hymen, has been notified, and agrees to call report to [phone].  Pt is to be transported via Patent examiner.  Edwinna Areola, Patient Access Specialist at [facility] has also been notified.   Doylene Canning, [place] Behavioral Health Coordinator [phone]

## 2020-03-03 NOTE — BHH Counselor (Signed)
CSW attempted to engage with the patient to complete psycho-social assessment. Patient declined to wake up for assessment at this time.    Polly Barner MSW, LCSWA Clincal Social Worker  [facility]   

## 2020-03-03 NOTE — ED Notes (Signed)
Per TTS, Pt can go to [facility] 504-1 after 1000.

## 2020-03-03 NOTE — BHH Counselor (Signed)
Adult Comprehensive Assessment  Patient ID: Jackson Williams, male   DOB: [birth year], 24 y.o.   MRN: [id number]  Information Source: Information source: Patient  Current Stressors:  Patient states their primary concerns and needs for treatment are:: "My parents dropped me off, I don't know why" Patient states their goals for this hospitilization and ongoing recovery are:: "To sleep better" Educational / Learning stressors: Denies Employment / Job issues: Denies Family Relationships: Denies Surveyor, quantity / Lack of resources (include bankruptcy): Denies Housing / Lack of housing: Denies Physical health (include injuries & life threatening diseases): Denies Social relationships: Denies Substance abuse: Denies Bereavement / Loss: Denies  Living/Environment/Situation:  Living Arrangements: Parent Living conditions (as described by patient or guardian): "Fine" Who else lives in the home?: Mother and father How long has patient lived in current situation?: "Not sure" What is atmosphere in current home: Comfortable  Family History:  Marital status: Long term relationship Long term relationship, how long?: 2 years What types of issues is patient dealing with in the relationship?: Denies issues Additional relationship information: None Are you sexually active?: Yes What is your sexual orientation?: Heterosexual Has your sexual activity been affected by drugs, alcohol, medication, or emotional stress?: Denies Does patient have children?: No  Childhood History:  By whom was/is the patient raised?: Both parents Additional childhood history information: "I had a good childhood" Description of patient's relationship with caregiver when they were a child: "Good" Patient's description of current relationship with people who raised him/her: "Still good" How were you disciplined when you got in trouble as a child/adolescent?: "Yelled at" Does patient have siblings?: No Did patient suffer any  verbal/emotional/physical/sexual abuse as a child?: No Did patient suffer from severe childhood neglect?: No Has patient ever been sexually abused/assaulted/raped as an adolescent or adult?: No Was the patient ever a victim of a crime or a disaster?: No Witnessed domestic violence?: No Has patient been affected by domestic violence as an adult?: No  Education:  Highest grade of school patient has completed: 12th grade Currently a student?: No Learning disability?: Yes What learning problems does patient have?: Had trouble focusing in school  Employment/Work Situation:   Employment situation: Unemployed Patient's job has been impacted by current illness: No What is the longest time patient has a held a job?: 7 years 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[employer]- [employer] Has patient ever been in the [employer]?: No  Financial Resources:   Surveyor, quantity resources: Support from parents / caregiver Does patient have a Lawyer or guardian?: No  Alcohol/Substance Abuse:   What has been your use of drugs/alcohol within the last 12 months?: Denies If attempted suicide, did drugs/alcohol play a role in this?: No Alcohol/Substance Abuse Treatment Hx: Denies past history Has alcohol/substance abuse ever caused legal problems?: No  Social Support System:   Conservation officer, nature Support System: Good Describe Community Support System: Parents, girlfriend Type of faith/religion: None How does patient's faith help to cope with current illness?: None  Leisure/Recreation:   Do You Have Hobbies?: Yes Leisure and Hobbies: "Playing games on my phone"  Strengths/Needs:   What is the patient's perception of their strengths?: "I don't know" Patient states they Williams use these personal strengths during their treatment to contribute to their recovery: N/A Patient states these barriers may affect/interfere with their treatment: None Patient states these barriers may affect  their return to the community: None Other important information patient would like considered in planning for their treatment: Is interested in receiving  therapy and medication management  Discharge Plan:   Currently receiving community mental health services: No Patient states concerns and preferences for aftercare planning are: Would like to be set up with therapy and medication management Patient states they will know when they are safe and ready for discharge when: "I'm not sure" Does patient have access to transportation?: Yes(Parents) Does patient have financial barriers related to discharge medications?: Yes Patient description of barriers related to discharge medications: No insurance Will patient be returning to same living situation after discharge?: Yes  Summary/Recommendations:   Summary and Recommendations (to be completed by the evaluator): Jackson Williams is an 24 y.o. male that presents this date with his step father Jackson Williams [phone]. Patient was observed to be paranoid and agitated on arrival. Patient was attempting to elope from ED and a IVC was initiated by provider. Patient was medicated and was observed to be drowsy at the time of assessment. Patient was asleep at the time of assessment with father at bedside. Patient could not participate in the assessment although this writer returned later with patient rendering limited information. Patient is speaking in a low soft voice that is incoherent at times. Patient is making statements unrelated to this writer's questions. Patient did nod his head no to S/I, H/I and AVH although this writer is uncertain if patient is comprehending the content of this writers's questions. Patient would not respond to any further questions. Information to complete assessment was obtained from admission notes and step father who was at bedside earlier providing collateral. Step father Jackson Williams states patient was seen and assessed on 02/29/20  when he presented with similar symptoms. Per that note stepfather states patient recently finished steroids for allergic reaction and now (02/29/20) for past 3 days has paranoia. Stepfather states that he and patient were going to leave the house, stepfather went back into house to grab something, patient ran in behind him and said it was scary outside in the car and people were trying to get him. Patient denies SI/HI at that time. Patient was observed and monitored at that time and later discharged. Jackson Williams states patient returned home and continued to exhibit signs of paranoia that gradually worsened until patient started becoming aggressive and disorganized today which prompted step father to bring patient back to ED. While here, Jackson Williams benefit from crisis stabilization, medication management, therapeutic milieu, and referrals for services.  Aarushi Hemric A Mariana Goytia. 03/03/2020

## 2020-03-03 NOTE — H&P (Signed)
Psychiatric Admission Assessment Adult  Patient Identification: Jackson Williams MRN:  [id number] [id number] Date of Evaluation:  03/03/2020 Chief Complaint: "I am not sure why I am here" Principal Diagnosis: Psychosis, unspecified.  Consider steroid induced psychosis Diagnosis:  Psychosis, unspecified.  Consider steroid induced psychosis History of Present Illness: 24 year old male.  Information is obtained from his family/stepfather, whom he he did not express consent to speak with ( Mr. Jackson Williams [phone])  He initially presented to ED on 6/5 with his stepfather.  At the time family reported patient had been treated with steroids for an allergic reaction (had developed an allergic reaction to a bee sting on 5/24 at which time he was initially given parenteral steroids and then a PO steroid course x 5 days) * Of note, stepfather also reports patient appeared to become more agitated and disinhibited after being given Hydroxyzine, which he had also been prescribed Stepfather reports that he " seemed to be doing OK" and at baseline until he recently developed paranoid ideations over the course of 2-3 days last week. He had completed steroid treatment by then. At the time  patient had expressed feeling scared that people were trying to harm him, get into his car.  He was brought to ED -in ED he presented paranoid and expressed fear that hospital staff was trying to kill him with IV fluids.  He was initially discharged home to parents as was feeling better. He returned  on 6/7, however, due to recurrent  paranoia, selective mutism, disorganized behavior. Family reports patient had not reported or presented with depression or significant neuro-vegetative symptoms. They report he has not made any suicidal statements or gestures nor has he made threats towards others .  *Currently patient is awake/reports feeling tired but is cooperative with interview.  Provides limited information.  He does endorse auditory  hallucinations which she describes as "we will having conversations".  He is unsure how long he has been experiencing these.  He also endorses some depression and states he has been feeling "lonely" and endorses recent decreased sleep, poor appetite and low energy level.  He denies having had any suicidal ideations. He denies any alcohol or drug abuse.  Parents also report they do not suspect any substance abuse. 6/5 and 6/7 UDS were both negative.  6/7 BAL was negative. A head CT scan done on 6/7 was unremarkable without evidence of acute intracranial abnormality Associated Signs/Symptoms: Depression Symptoms:  Patient reports decreased appetite and energy level.   (Hypo) Manic Symptoms: Recent restlessness and agitation.  At this time presents calm without overt psychomotor agitation Anxiety Symptoms:  Patient endorses some anxiety Psychotic Symptoms: reports auditory hallucinations which he describes as "people having conversations". PTSD Symptoms: Does not endorse  Total Time spent with patient: 45 minutes  Past Psychiatric History: No prior psychiatric admissions. As per stepfather, patient has no prior history of psychosis. No prior history of severe depressive episodes or of mania/hypomania.   He has not been on psychiatric medications in the past . No history of suicide attempts   Is the patient at risk to self? Yes.    Has the patient been a risk to self in the past 6 months? No.  Has the patient been a risk to self within the distant past? No.  Is the patient a risk to others? No.  Has the patient been a risk to others in the past 6 months? No.  Has the patient been a risk to others within the distant past? No.  Prior Inpatient Therapy:   none  Prior Outpatient Therapy:  none  Alcohol Screening: 1. How often do you have a drink containing alcohol?: Never 2. How many drinks containing alcohol do you have on a typical day when you are drinking?: 1 or 2(Patient denies using  alcohol.) 3. How often do you have six or more drinks on one occasion?: Never AUDIT-C Score: 0 4. How often during the last year have you found that you were not able to stop drinking once you had started?: Never 5. How often during the last year have you failed to do what was normally expected from you because of drinking?: Never 6. How often during the last year have you needed a first drink in the morning to get yourself going after a heavy drinking session?: Never 7. How often during the last year have you had a feeling of guilt of remorse after drinking?: Never 8. How often during the last year have you been unable to remember what happened the night before because you had been drinking?: Never 9. Have you or someone else been injured as a result of your drinking?: No 10. Has a relative or friend or a doctor or another health worker been concerned about your drinking or suggested you cut down?: No Alcohol Use Disorder Identification Test Final Score (AUDIT): 0 Substance Abuse History in the last 12 months:  He denies alcohol or drug abuse . Family reports they have not noted any drug use. Admission UDS negative, BAL negative.  Consequences of Substance Abuse: Denies  Previous Psychotropic Medications: no prior history of past  psychiatric medication management .  Psychological Evaluations: No  Past Medical History: History reviewed. No pertinent past medical history. History reviewed. No pertinent surgical history. Family History: patient reports he is currently living with his mother and stepfather. States he has no contact with his biological father. He is only child. 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 Social History   Substance and Sexual Activity  Alcohol Use No      Social History   Substance and Sexual Activity  Drug Use No    Additional Social History: Marital status: Long term relationship Long term relationship, how long?: 2 years What types of issues is patient dealing with in the relationship?: Denies issues Additional relationship information: None Are you sexually active?: Yes What is your sexual orientation?: Heterosexual Has your sexual activity been affected by drugs, alcohol, medication, or emotional stress?: Denies Does patient have children?: No  Allergies:   Allergies  Allergen Reactions  . Bee Venom   . Other Other (See Comments)    Shell fish : Swelling.   Lab Results:  Results for orders placed or performed during the hospital encounter of 03/02/20 (from the past 48 hour(s))  Comprehensive metabolic panel     Status: None   Collection Time: 03/02/20 12:47 PM  Result Value Ref Range   Sodium 138 135 - 145 mmol/L   Potassium 4.0 3.5 - 5.1 mmol/L   Chloride 104 98 - 111 mmol/L   CO2 27 22 - 32 mmol/L   Glucose, Bld 98 70 - 99 mg/dL    Comment: Glucose reference range applies only to samples taken after fasting for at least 8 hours.   BUN 18 6 - 20 mg/dL   Creatinine, Ser  1.24 0.61 - 1.24 mg/dL   Calcium 9.4 8.9 - 16.1 mg/dL   Total Protein 7.6 6.5 - 8.1 g/dL   Albumin 4.5 3.5 - 5.0 g/dL   AST 18 15 - 41 U/L   ALT 18 0 - 44 U/L   Alkaline Phosphatase 68 38 - 126 U/L   Total Bilirubin 1.0 0.3 - 1.2 mg/dL   GFR calc non Af Amer >60 >60 mL/min   GFR calc Af Amer >60 >60 mL/min   Anion gap 7 5 - 15    Comment: Performed at [facility], [address] [place], [place] [address]  Ethanol     Status: None   Collection Time: 03/02/20 12:47 PM  Result Value Ref Range   Alcohol, Ethyl (B) <10 <10 mg/dL    Comment: (NOTE) Lowest detectable limit for serum alcohol is 10 mg/dL. For medical purposes only. Performed at [facility], [address] [place], [place] [address]   cbc      Status: None   Collection Time: 03/02/20 12:47 PM  Result Value Ref Range   WBC 10.5 4.0 - 10.5 K/uL   RBC 5.64 4.22 - 5.81 MIL/uL   Hemoglobin 16.7 13.0 - 17.0 g/dL   HCT 11.9 14.7 - 82.9 %   MCV 90.2 80.0 - 100.0 fL   MCH 29.6 26.0 - 34.0 pg   MCHC 32.8 30.0 - 36.0 g/dL   RDW 56.2 13.0 - 86.5 %   Platelets 317 150 - 400 K/uL   nRBC 0.0 0.0 - 0.2 %    Comment: Performed at [facility], [address], [place], [place] [address]  Salicylate level     Status: Abnormal   Collection Time: 03/02/20 12:47 PM  Result Value Ref Range   Salicylate Lvl <7.0 (L) 7.0 - 30.0 mg/dL    Comment: Performed at [facility], [address], [place], [place] [address]  Acetaminophen level     Status: Abnormal   Collection Time: 03/02/20 12:47 PM  Result Value Ref Range   Acetaminophen (Tylenol), Serum <10 (L) 10 - 30 ug/mL    Comment: (NOTE) Therapeutic concentrations vary significantly. A range of 10-30 ug/mL  may be an effective concentration for many patients. However, some  are best treated at concentrations outside of this range. Acetaminophen concentrations >150 ug/mL at 4 hours after ingestion  and >50 ug/mL at 12 hours after ingestion are often associated with  toxic reactions. Performed at [facility], [address], [place], [place] [address]   TSH     Status: None   Collection Time: 03/02/20  3:45 PM  Result Value Ref Range   TSH 1.301 0.350 - 4.500 uIU/mL    Comment: Performed by a 3rd Generation assay with a functional sensitivity of <=0.01 uIU/mL. Performed at [facility], [address] [place], [place] [address]   SARS Coronavirus 2 by RT PCR (hospital order, performed in [facility] hospital lab) Nasopharyngeal Nasopharyngeal Swab     Status: None   Collection Time: 03/02/20  4:10 PM   Specimen: Nasopharyngeal Swab  Result Value Ref Range   SARS Coronavirus 2 NEGATIVE NEGATIVE    Comment:  (NOTE) SARS-CoV-2 target nucleic acids are NOT DETECTED. The SARS-CoV-2 RNA is generally detectable in upper and lower respiratory specimens during the acute phase of infection. The lowest concentration of SARS-CoV-2 viral copies this assay can detect is 250 copies / mL. A negative result does  not preclude SARS-CoV-2 infection and should not be used as the sole basis for treatment or other patient management decisions.  A negative result may occur with improper specimen collection / handling, submission of specimen other than nasopharyngeal swab, presence of viral mutation(s) within the areas targeted by this assay, and inadequate number of viral copies (<250 copies / mL). A negative result must be combined with clinical observations, patient history, and epidemiological information. Fact Sheet for Patients:   StrictlyIdeas.no Fact Sheet for Healthcare Providers: BankingDealers.co.za This test is not yet approved or cleared  by the [place] FDA and has been authorized for detection and/or diagnosis of SARS-CoV-2 by FDA under an Emergency Use Authorization (EUA).  This EUA will remain in effect (meaning this test can be used) for the duration of the COVID-19 declaration under Section 564(b)(1) of the Act, 21 U.S.C. section 360bbb-3(b)(1), unless the authorization is terminated or revoked sooner. Performed at [facility], [place] [address], [place], [place] [address]   Rapid urine drug screen (hospital performed)     Status: None   Collection Time: 03/02/20  6:42 PM  Result Value Ref Range   Opiates NONE DETECTED NONE DETECTED   Cocaine NONE DETECTED NONE DETECTED   Benzodiazepines NONE DETECTED NONE DETECTED   Amphetamines NONE DETECTED NONE DETECTED   Tetrahydrocannabinol NONE DETECTED NONE DETECTED   Barbiturates NONE DETECTED NONE DETECTED    Comment: (NOTE) DRUG SCREEN FOR MEDICAL PURPOSES ONLY.  IF  CONFIRMATION IS NEEDED FOR ANY PURPOSE, NOTIFY LAB WITHIN 5 DAYS. LOWEST DETECTABLE LIMITS FOR URINE DRUG SCREEN Drug Class                     Cutoff (ng/mL) Amphetamine and metabolites    1000 Barbiturate and metabolites    200 Benzodiazepine                 485 Tricyclics and metabolites     300 Opiates and metabolites        300 Cocaine and metabolites        300 THC                            50 Performed at [facility], [place] [address] [place], [place] [address]     Blood Alcohol level:  Lab Results  Component Value Date   ETH <10 35/00/9381    Metabolic Disorder Labs:  No results found for: HGBA1C, MPG No results found for: PROLACTIN No results found for: CHOL, TRIG, HDL, CHOLHDL, VLDL, LDLCALC  Current Medications: No current facility-administered medications for this encounter.   PTA Medications: Medications Prior to Admission  Medication Sig Dispense Refill Last Dose  . diphenhydrAMINE HCl (BENADRYL ALLERGY PO) Take 1 tablet by mouth daily as needed (for allergy, itching).      . diphenhydrAMINE-zinc acetate (BENADRYL) cream Apply 1 application topically 3 (three) times daily as needed for itching.     Marland Kitchen EPINEPHrine 0.3 mg/0.3 mL IJ SOAJ injection Inject 0.3 mLs (0.3 mg total) into the muscle as needed for anaphylaxis. 1 each 0   . hydrOXYzine (ATARAX/VISTARIL) 25 MG tablet Take 1 tablet (25 mg total) by mouth every 6 (six) hours as needed for anxiety, itching or nausea. (Patient not taking: Reported on 03/02/2020) 12 tablet 0     Musculoskeletal: Strength & Muscle Tone: within normal limits no current restlessness or agitation Gait & Station: normal Patient leans: N/A  Psychiatric Specialty Exam: Physical Exam  Review of Systems  Constitutional: Negative.   HENT: Negative.   Eyes: Negative.   Respiratory: Negative.   Cardiovascular: Negative.   Gastrointestinal: Negative.   Endocrine: Negative.   Genitourinary: Negative.    Musculoskeletal: Negative.   Skin: Negative.   Allergic/Immunologic: Negative.   Neurological: Negative.   Hematological: Negative.   Psychiatric/Behavioral: Positive for confusion and hallucinations.    Blood pressure 119/90, pulse 84, temperature 98.6 F (37 C), temperature source Oral, resp. rate 18, height 5\' 7"  (1.702 m), weight 75.3 kg, SpO2 99 %.Body mass index is 26 kg/m.  General Appearance: Fairly Groomed  Eye Contact:  Fair, improves partially during session  Speech:  Normal Rate  Volume:  Decreased  Mood:  endorses some depression   Affect:  blunted   Thought Process:  Generally linear , concrete.   Orientation:  Other:  oriented to 03/03/2020 and to " hospital in [facility]"  Thought Content:  endorses auditory hallucinations , currently does not appear internally preoccupied   Suicidal Thoughts:  No denies suicidal or self injurious ideations, no homicidal or violent ideations  Homicidal Thoughts:  No  Memory:  recent and remote fair   Judgement:  Fair  Insight:  Fair  Psychomotor Activity:  Decreased-  Concentration:  Concentration: Fair and Attention Span: Fair  Recall:  [facility] of Knowledge:  Fair  Language:  Fair  Akathisia:  Negative  Handed:  Right  AIMS (if indicated):     Assets:  Desire for Improvement Resilience Social Support  ADL's: Fair  Cognition:  Impaired,  Mild  Sleep:       Treatment Plan Summary: Daily contact with patient to assess and evaluate symptoms and progress in treatment, Medication management, Plan inpatient admission and medications as needed   Observation Level/Precautions:  15 minute checks  Laboratory: Labs reviewed-CBC unremarkable, CMP unremarkable, TSH 1.30 Will order hemoglobin A1c, lipid panel, EKG to monitor QTC  Psychotherapy: Milieu/group therapy  Medications: Start Zyprexa 5 mg nightly. Agitation protocol as needed.  Based on stepfather reported patient had recent admission following a dose of hydroxyzine  prior to admission will avoid antihistamines at this time  Consultations: As needed  Discharge Concerns:  -  Estimated LOS: 4 to 5 days  Other:     Physician Treatment Plan for Primary Diagnosis: Psychosis unspecified, consider steroid-induced psychosis Long Term Goal(s): Improvement in symptoms so as ready for discharge  Short Term Goals: Ability to identify changes in lifestyle to reduce recurrence of condition will improve, Ability to verbalize feelings will improve, Ability to disclose and discuss suicidal ideas, Ability to demonstrate self-control will improve, Ability to identify and develop effective coping behaviors will improve, Ability to maintain clinical measurements within normal limits will improve and Compliance with prescribed medications will improve  Physician Treatment Plan for Secondary Diagnosis: Psychosis, unspecified Long Term Goal(s): Improvement in symptoms so as ready for discharge  Short Term Goals: Ability to identify changes in lifestyle to reduce recurrence of condition will improve, Ability to verbalize feelings will improve, Ability to disclose and discuss suicidal ideas, Ability to demonstrate self-control will improve, Ability to identify and develop effective coping behaviors will improve, Ability to maintain clinical measurements within normal limits will improve and Compliance with prescribed medications will improve  I certify that inpatient services furnished can reasonably be expected to improve the patient's condition.    [employer], MD 6/8/20214:30 PM

## 2020-03-04 DIAGNOSIS — F29 Unspecified psychosis not due to a substance or known physiological condition: Secondary | ICD-10-CM | POA: Diagnosis present

## 2020-03-04 DIAGNOSIS — F23 Brief psychotic disorder: Secondary | ICD-10-CM

## 2020-03-04 LAB — LIPID PANEL
Cholesterol: 213 mg/dL — ABNORMAL HIGH (ref 0–200)
HDL: 62 mg/dL (ref >40)
LDL Cholesterol: 131 mg/dL — ABNORMAL HIGH (ref 0–99)
Total CHOL/HDL Ratio: 3.4 RATIO
Triglycerides: 100 mg/dL (ref <150)
VLDL: 20 mg/dL (ref 0–40)

## 2020-03-04 LAB — HEMOGLOBIN A1C
Hgb A1c MFr Bld: 5.5 % (ref 4.8–5.6)
Mean Plasma Glucose: 111.15 mg/dL

## 2020-03-04 MED ORDER — RISPERIDONE 2 MG PO TBDP: 2.0000 mg | ORAL_TABLET | Freq: Three times a day (TID) | ORAL | Status: DC | PRN

## 2020-03-04 MED ORDER — ACETAMINOPHEN 325 MG PO TABS: 650.0000 mg | ORAL_TABLET | Freq: Four times a day (QID) | ORAL | Status: DC | PRN

## 2020-03-04 MED ORDER — ZIPRASIDONE MESYLATE 20 MG IM SOLR: 20.0000 mg | INTRAMUSCULAR | Status: DC | PRN

## 2020-03-04 MED ORDER — LORAZEPAM 1 MG PO TABS: 1.0000 mg | ORAL_TABLET | ORAL | Status: DC | PRN

## 2020-03-04 MED ORDER — ALUM & MAG HYDROXIDE-SIMETH 200-200-20 MG/5ML PO SUSP: 30.0000 mL | ORAL | Status: DC | PRN

## 2020-03-04 MED ORDER — MAGNESIUM HYDROXIDE 400 MG/5ML PO SUSP: 30.0000 mL | Freq: Every day | ORAL | Status: DC | PRN

## 2020-03-04 MED ORDER — HYDROXYZINE HCL 25 MG PO TABS
25.0000 mg | ORAL_TABLET | Freq: Three times a day (TID) | ORAL | Status: DC | PRN
  Filled 2020-03-04: qty 10

## 2020-03-04 NOTE — [facility] Suicide Risk Assessment (Signed)
[facility] INPATIENT:  Family/Significant Other Suicide Prevention Education    Suicide Prevention Education: Education Completed; Father, Jackson Williams [phone]), has been identified by the patient as the family member/significant other with whom the patient will be residing, and identified as the person(s) who will aid the patient in the event of a mental health crisis (suicidal ideations/suicide attempt).  With written consent from the patient, the family member/significant other has been provided the following suicide prevention education, prior to the and/or following the discharge of the patient.  The suicide prevention education provided includes the following:  Suicide risk factors  Suicide prevention and interventions  National Suicide Hotline telephone number  [facility] assessment telephone number  [facility] Emergency Assistance 911  [facility] and/or Residential Mobile Crisis Unit telephone number   Request made of family/significant other to:  Remove weapons (e.g., guns, rifles, knives), all items previously/currently identified as safety concern.    Remove drugs/medications (over-the-counter, prescriptions, illicit drugs), all items previously/currently identified as a safety concern.   The family member/significant other verbalizes understanding of the suicide prevention education information provided.  The family member/significant other agrees to remove the items of safety concern listed above.  CSW spoke with patients father who stated that prior to this experience this pt has had no psychiatric hx. Patients father stated that this patient has been increasingly paranoid and a lot of his paranoia has been targeted at his girlfriend. Pt's father stated that pt has been making comments around his gf "setting this up" and having a "Third eye." Per pt's father pt's culture is very superstitious and believe in different "magical" happenings. Pt's father  stated that pt's girlfriend has been very good for him and is concerned this may impact their relationship. Pt's father also stated that this pt has been expressing thoughts of guilt around being a burden on his parents and feeling guilty for money they have spent on him. Pt's father stated that he and his wife went to his house and made sure there were no weapons or drugs in the home. Per pt's father this pt is able to return to live with them post discharge and he has no safety concerns at this time.   Jackson Williams MSW, [employer] Clincal Social Worker  [facility]

## 2020-03-04 NOTE — Tx Team (Cosign Needed)
Interdisciplinary Treatment and Diagnostic Plan Update  03/04/2020 Time of Session: 9:00am Jackson Williams MRN: [id number]  Principal Diagnosis: <principal problem not specified>  Secondary Diagnoses: Active Problems:   * No active hospital problems. *   Current Medications:  Current Facility-Administered Medications  Medication Dose Route Frequency Provider Last Rate Last Admin  . OLANZapine zydis (ZYPREXA) disintegrating tablet 5 mg  5 mg Oral Q8H PRN Cobos, Myer Peer, MD       And  . LORazepam (ATIVAN) tablet 1 mg  1 mg Oral PRN Cobos, Myer Peer, MD       And  . ziprasidone (GEODON) injection 20 mg  20 mg Intramuscular PRN Cobos, Myer Peer, MD      . OLANZapine (ZYPREXA) tablet 5 mg  5 mg Oral QHS Cobos, Myer Peer, MD   5 mg at 03/03/20 2106   PTA Medications: Medications Prior to Admission  Medication Sig Dispense Refill Last Dose  . diphenhydrAMINE HCl (BENADRYL ALLERGY PO) Take 1 tablet by mouth daily as needed (for allergy, itching).      . diphenhydrAMINE-zinc acetate (BENADRYL) cream Apply 1 application topically 3 (three) times daily as needed for itching.     Marland Kitchen EPINEPHrine 0.3 mg/0.3 mL IJ SOAJ injection Inject 0.3 mLs (0.3 mg total) into the muscle as needed for anaphylaxis. 1 each 0   . hydrOXYzine (ATARAX/VISTARIL) 25 MG tablet Take 1 tablet (25 mg total) by mouth every 6 (six) hours as needed for anxiety, itching or nausea. (Patient not taking: Reported on 03/02/2020) 12 tablet 0     Patient Stressors: Health problems Occupational concerns  Patient Strengths: Ability for insight Average or above average intelligence Supportive family/friends  Treatment Modalities: Medication Management, Group therapy, Case management,  1 to 1 session with clinician, Psychoeducation, Recreational therapy.   Physician Treatment Plan for Primary Diagnosis: <principal problem not specified> Long Term Goal(s): Improvement in symptoms so as ready for discharge Improvement in  symptoms so as ready for discharge   Short Term Goals: Ability to identify changes in lifestyle to reduce recurrence of condition will improve Ability to verbalize feelings will improve Ability to disclose and discuss suicidal ideas Ability to demonstrate self-control will improve Ability to identify and develop effective coping behaviors will improve Ability to maintain clinical measurements within normal limits will improve Compliance with prescribed medications will improve Ability to identify changes in lifestyle to reduce recurrence of condition will improve Ability to verbalize feelings will improve Ability to disclose and discuss suicidal ideas Ability to demonstrate self-control will improve Ability to identify and develop effective coping behaviors will improve Ability to maintain clinical measurements within normal limits will improve Compliance with prescribed medications will improve  Medication Management: Evaluate patient's response, side effects, and tolerance of medication regimen.  Therapeutic Interventions: 1 to 1 sessions, Unit Group sessions and Medication administration.  Evaluation of Outcomes: Not Met  Physician Treatment Plan for Secondary Diagnosis: Active Problems:   * No active hospital problems. *  Long Term Goal(s): Improvement in symptoms so as ready for discharge Improvement in symptoms so as ready for discharge   Short Term Goals: Ability to identify changes in lifestyle to reduce recurrence of condition will improve Ability to verbalize feelings will improve Ability to disclose and discuss suicidal ideas Ability to demonstrate self-control will improve Ability to identify and develop effective coping behaviors will improve Ability to maintain clinical measurements within normal limits will improve Compliance with prescribed medications will improve Ability to identify changes in lifestyle to reduce  recurrence of condition will improve Ability to  verbalize feelings will improve Ability to disclose and discuss suicidal ideas Ability to demonstrate self-control will improve Ability to identify and develop effective coping behaviors will improve Ability to maintain clinical measurements within normal limits will improve Compliance with prescribed medications will improve     Medication Management: Evaluate patient's response, side effects, and tolerance of medication regimen.  Therapeutic Interventions: 1 to 1 sessions, Unit Group sessions and Medication administration.  Evaluation of Outcomes: Not Met   RN Treatment Plan for Primary Diagnosis: <principal problem not specified> Long Term Goal(s): Knowledge of disease and therapeutic regimen to maintain health will improve  Short Term Goals: Ability to participate in decision making will improve, Ability to verbalize feelings will improve, Ability to identify and develop effective coping behaviors will improve and Compliance with prescribed medications will improve  Medication Management: RN will administer medications as ordered by provider, will assess and evaluate patient's response and provide education to patient for prescribed medication. RN will report any adverse and/or side effects to prescribing provider.  Therapeutic Interventions: 1 on 1 counseling sessions, Psychoeducation, Medication administration, Evaluate responses to treatment, Monitor vital signs and CBGs as ordered, Perform/monitor CIWA, COWS, AIMS and Fall Risk screenings as ordered, Perform wound care treatments as ordered.  Evaluation of Outcomes: Not Met   LCSW Treatment Plan for Primary Diagnosis: <principal problem not specified> Long Term Goal(s): Safe transition to appropriate next level of care at discharge, Engage patient in therapeutic group addressing interpersonal concerns.  Short Term Goals: Engage patient in aftercare planning with referrals and resources, Facilitate acceptance of mental health  diagnosis and concerns, Identify triggers associated with mental health/substance abuse issues and Increase skills for wellness and recovery  Therapeutic Interventions: Assess for all discharge needs, 1 to 1 time with Social worker, Explore available resources and support systems, Assess for adequacy in community support network, Educate family and significant other(s) on suicide prevention, Complete Psychosocial Assessment, Interpersonal group therapy.  Evaluation of Outcomes: Not Met   Progress in Treatment: Attending groups: No. Participating in groups: No. Taking medication as prescribed: Yes. Toleration medication: Yes. Family/Significant other contact made: No, will contact:  father, Carlyle Lipa Patient understands diagnosis: No. Discussing patient identified problems/goals with staff: No. Medical problems stabilized or resolved: Yes. Denies suicidal/homicidal ideation: Yes. Issues/concerns per patient self-inventory: Yes.  New problem(s) identified: Yes, Describe:  CSW continuing to assess  New Short Term/Long Term Goal(s): medication management for mood stabilization; elimination of SI thoughts; development of comprehensive mental wellness/sobriety plan.  Patient Goals:    Discharge Plan or Barriers: Will be referred to [facility] for medication management and therapy.  Reason for Continuation of Hospitalization: Anxiety Delusions  Depression Medication stabilization  Estimated Length of Stay: 5-7 days  Attendees: Patient: 03/04/2020 9:57 AM  Physician:  03/04/2020 9:57 AM  Nursing:  03/04/2020 9:57 AM  RN Care Manager: 03/04/2020 9:57 AM  Social Worker: Stephanie Acre, [place] 03/04/2020 9:57 AM  Recreational Therapist:  03/04/2020 9:57 AM  Other:  03/04/2020 9:57 AM  Other:  03/04/2020 9:57 AM  Other: 03/04/2020 9:57 AM    Scribe for Treatment Team: Joellen Jersey, [place] 03/04/2020 9:57 AM

## 2020-03-04 NOTE — Progress Notes (Signed)
Pt continues to be paranoid on the unit and needs much encouragement to take medication    03/04/20 2100  Psych Admission Type (Psych Patients Only)  Admission Status Involuntary  Psychosocial Assessment  Patient Complaints Suspiciousness  Eye Contact Brief;Fair  Facial Expression Anxious  Affect Anxious  Speech Incoherent;Slow  Interaction Cautious;Avoidant  Motor Activity Slow  Appearance/Hygiene Disheveled  Behavior Characteristics Guarded;Anxious  Mood Suspicious  Aggressive Behavior  Effect No apparent injury  Thought Process  Coherency Incoherent  Content WDL  Delusions WDL  Perception WDL  Hallucination UTA  Judgment Poor  Confusion WDL  Danger to Self  Current suicidal ideation? Denies  Danger to Others  Danger to Others None reported or observed

## 2020-03-04 NOTE — Progress Notes (Signed)
[facility] MD Progress Note  03/04/2020 4:42 PM Jackson Williams  MRN:  [id number]  Subjective: Jackson Williams reports, "I feel sleepy & depressed".  Objective: 24 year old male.  Information is obtained from his family/stepfather, whom he he did not express consent to speak with (Jackson Williams [id number]).He initially presented to ED on 02/29/20 with his stepfather.  At the time family reported patient had been treated with steroids for an allergic reaction (had developed an allergic reaction to a bee sting on 02/17/20 at which time he was initially given parenteral steroids and then a PO steroid course x 5 days) * Of note, stepfather also reports patient appeared to become more agitated and disinhibited after being given Hydroxyzine, which he had also been prescribed. Stepfather reports that he " seemed to be doing OK" and at baseline until he recently developed paranoid ideations over the course of 2-3 days last week. He had completed steroid treatment by then. Somechart is seen, chart reviewed. He presents sleepy, speaks softly. He is unable to provide much information about how he is doing other than feeling sleepy. Staff reports that patient refused his Vistaril last night while presenting paranoid. Patient at this time does not appear to be in any apparent distress.  Principal Problem: Brief psychotic disorder ([name])  Diagnosis: Principal Problem:   Brief psychotic disorder ([name])  Total Time spent with patient: 25 minutes  Past Psychiatric History: See H&P  Past Medical History: History reviewed. No pertinent past medical history. History reviewed. No pertinent surgical history.  Family History: History reviewed. No pertinent family history.  Family Psychiatric  History: See H&P  Social History:  Social History   Substance and Sexual Activity  Alcohol Use No     Social History   Substance and Sexual Activity  Drug Use No    Social History   Socioeconomic History  . Marital status: Single     Spouse name: Not on file  . Number of children: Not on file  . Years of education: Not on file  . Highest education level: Not on file  Occupational History  . Not on file  Tobacco Use  . Smoking status: Never Smoker  . Smokeless tobacco: Never Used  Substance and Sexual Activity  . Alcohol use: No  . Drug use: No  . Sexual activity: Not on file  Other Topics Concern  . Not on file  Social History Narrative  . Not on file   Social Determinants of Health   Financial Resource Strain:   . Difficulty of Paying Living Expenses:   Food Insecurity:   . Worried About Charity fundraiser in the Last Year:   . Arboriculturist in the Last Year:   Transportation Needs:   . Film/video editor (Medical):   Marland Kitchen Lack of Transportation (Non-Medical):   Physical Activity:   . Days of Exercise per Week:   . Minutes of Exercise per Session:   Stress:   . Feeling of Stress :   Social Connections:   . Frequency of Communication with Friends and Family:   . Frequency of Social Gatherings with Friends and Family:   . Attends Religious Services:   . Active Member of Clubs or Organizations:   . Attends Archivist Meetings:   Marland Kitchen Marital Status:    Additional Social History:   Sleep: Good  Appetite:  Good  Current Medications: Current Facility-Administered Medications  Medication Dose Route Frequency Provider Last Rate Last Admin  . OLANZapine zydis (ZYPREXA) disintegrating  tablet 5 mg  5 mg Oral Q8H PRN Cobos, Rockey Situ, MD       And  . LORazepam (ATIVAN) tablet 1 mg  1 mg Oral PRN Cobos, Rockey Situ, MD       And  . ziprasidone (GEODON) injection 20 mg  20 mg Intramuscular PRN Cobos, Rockey Situ, MD      . OLANZapine (ZYPREXA) tablet 5 mg  5 mg Oral QHS Cobos, Rockey Situ, MD   5 mg at 03/03/20 2106    Lab Results:  Results for orders placed or performed during the hospital encounter of 03/03/20 (from the past 48 hour(s))  Hemoglobin A1c     Status: None   Collection  Time: 03/04/20  6:47 AM  Result Value Ref Range   Hgb A1c MFr Bld 5.5 4.8 - 5.6 %    Comment: (NOTE) Pre diabetes:          5.7%-6.4% Diabetes:              >6.4% Glycemic control for   <7.0% adults with diabetes    Mean Plasma Glucose 111.15 mg/dL    Comment: Performed at [facility], [address] [place], [place] [address]  Lipid panel     Status: Abnormal   Collection Time: 03/04/20  6:47 AM  Result Value Ref Range   Cholesterol 213 (H) 0 - 200 mg/dL   Triglycerides 024 <097 mg/dL   HDL 62 >35 mg/dL   Total CHOL/HDL Ratio 3.4 RATIO   VLDL 20 0 - 40 mg/dL   LDL Cholesterol 329 (H) 0 - 99 mg/dL    Comment:        Total Cholesterol/HDL:CHD Risk Coronary Heart Disease Risk Table                     Men   Women  1/2 Average Risk   3.4   3.3  Average Risk       5.0   4.4  2 X Average Risk   9.6   7.1  3 X Average Risk  23.4   11.0        Use the calculated Patient Ratio above and the CHD Risk Table to determine the patient's CHD Risk.        ATP III CLASSIFICATION (LDL):  <100     mg/dL   Optimal  924-268  mg/dL   Near or Above                    Optimal  130-159  mg/dL   Borderline  341-962  mg/dL   High  >229     mg/dL   Very High Performed at [facility], [address] [place], [place] [address]    Blood Alcohol level:  Lab Results  Component Value Date   ETH <10 03/02/2020   Metabolic Disorder Labs: Lab Results  Component Value Date   HGBA1C 5.5 03/04/2020   MPG 111.15 03/04/2020   No results found for: PROLACTIN Lab Results  Component Value Date   CHOL 213 (H) 03/04/2020   TRIG 100 03/04/2020   HDL 62 03/04/2020   CHOLHDL 3.4 03/04/2020   VLDL 20 03/04/2020   LDLCALC 131 (H) 03/04/2020    Physical Findings: AIMS:  , ,  ,  ,    CIWA:    COWS:     Musculoskeletal: Strength & Muscle Tone: within normal limits Gait & Station: normal Patient leans: N/A  Psychiatric Specialty Exam: Physical Exam  Nursing note and  vitals reviewed. Constitutional: He is oriented to person, place, and time. He appears well-developed.  Cardiovascular: Normal rate.  Respiratory: Effort normal.  Genitourinary:    Genitourinary Comments: Deferred   Musculoskeletal:        General: Normal range of motion.     Cervical back: Normal range of motion.  Neurological: He is alert and oriented to person, place, and time.  Skin: Skin is warm and dry.    Review of Systems  Constitutional: Negative.   HENT: Negative.   Eyes: Negative.   Respiratory: Negative.   Cardiovascular: Negative.   Gastrointestinal: Negative.   Endocrine: Negative for cold intolerance.  Genitourinary: Negative for difficulty urinating.  Musculoskeletal: Negative.   Allergic/Immunologic: Positive for environmental allergies (Bee Venom).       Allergies: Bee Venom  Neurological: Negative.   Psychiatric/Behavioral: Positive for confusion, decreased concentration, dysphoric mood and hallucinations (Paranoia). Negative for agitation, behavioral problems, self-injury, sleep disturbance and suicidal ideas. The patient is not nervous/anxious and is not hyperactive.     Blood pressure 134/88, pulse 94, temperature 98.2 F (36.8 C), temperature source Oral, resp. rate 18, height 5\' 7"  (1.702 m), weight 75.3 kg, SpO2 99 %.Body mass index is 26 kg/m.  General Appearance: Fairly Groomed  Eye Contact:  Fair, improves partially during session  Speech:  Normal Rate  Volume:  Decreased  Mood:  endorses some depression   Affect:  blunted   Thought Process:  Generally linear , concrete.   Orientation:  Other:  oriented to 03/03/2020 and to " hospital in [facility]"  Thought Content:  endorses auditory hallucinations , currently does not appear internally preoccupied   Suicidal Thoughts:  No denies suicidal or self injurious ideations, no homicidal or violent ideations  Homicidal Thoughts:  No  Memory:  recent and remote fair   Judgement:  Fair  Insight:  Fair   Psychomotor Activity:  Decreased-  Concentration:  Concentration: Fair and Attention Span: Fair  Recall:  [facility] of Knowledge:  Fair  Language:  Fair  Akathisia:  Negative  Handed:  Right  AIMS (if indicated):     Assets:  Desire for Improvement Resilience Social Support  ADL's: Fair  Cognition:  Impaired,  Mild    Sleep:  Number of Hours: 6.25   Treatment Plan Summary: Daily contact with patient to assess and evaluate symptoms and progress in treatment and Medication management.  - Continue inpatient hospitalization. - Will continue today 03/04/2020 plan as below except where it is noted.  Mood control.    - Continue Olanzapine 5 mg po Q hs.  Agitation/psychosis protocols.    - Continue Zyprexa Zydis 5 mg po Q 8 hrs prn.       &    - Continue Lorazepam 1 mg po prn x 1 dose        &    - Continue Geodon 20 mg IM prn x 1 dose.  Anxiety.     - Continue Vistaril 25 mg po tid prn.  Agitation.     Continue Risperdal M-tabs 2 mg po Q 8 hrs prn.     &     Lorazepam 1 mg po prn x 1 dose.     &     Geodon 20 mg po IM prn.  Encourage group participation. Discharge disposition plan in progress.  05/04/2020, NP, PMHNP, FNP-BC 03/04/2020, 4:42 PM

## 2020-03-05 LAB — LIPID PANEL
Cholesterol: 202 mg/dL — ABNORMAL HIGH (ref 0–200)
HDL: 57 mg/dL (ref >40)
LDL Cholesterol: 127 mg/dL — ABNORMAL HIGH (ref 0–99)
Total CHOL/HDL Ratio: 3.5 RATIO
Triglycerides: 89 mg/dL (ref <150)
VLDL: 18 mg/dL (ref 0–40)

## 2020-03-05 LAB — HEMOGLOBIN A1C
Hgb A1c MFr Bld: 5.5 % (ref 4.8–5.6)
Mean Plasma Glucose: 111.15 mg/dL

## 2020-03-05 LAB — TSH: TSH: 1.52 u[IU]/mL (ref 0.350–4.500)

## 2020-03-05 MED ORDER — SERTRALINE HCL 25 MG PO TABS
25.0000 mg | ORAL_TABLET | Freq: Every day | ORAL | Status: DC
  Administered 2020-03-05 – 2020-03-06 (×2): 25 mg via ORAL
  Filled 2020-03-05 (×4): qty 1

## 2020-03-05 MED ORDER — OLANZAPINE 7.5 MG PO TABS
7.5000 mg | ORAL_TABLET | Freq: Every day | ORAL | Status: DC
  Administered 2020-03-05: 7.5 mg via ORAL
  Filled 2020-03-05 (×2): qty 1

## 2020-03-05 NOTE — Progress Notes (Signed)
°   03/04/20 2100  Psych Admission Type (Psych Patients Only)  Admission Status Involuntary  Psychosocial Assessment  Patient Complaints Suspiciousness  Eye Contact Brief;Fair  Facial Expression Anxious  Affect Anxious  Speech Incoherent;Slow  Interaction Cautious;Avoidant  Motor Activity Slow  Appearance/Hygiene Disheveled  Behavior Characteristics Guarded;Anxious  Mood Suspicious  Aggressive Behavior  Effect No apparent injury  Thought Process  Coherency Incoherent  Content WDL  Delusions WDL  Perception WDL  Hallucination UTA  Judgment Poor  Confusion WDL  Danger to Self  Current suicidal ideation? Denies  Danger to Others  Danger to Others None reported or observed

## 2020-03-05 NOTE — Progress Notes (Signed)
Pt continues to be paranoid, but pt a little less suspicious when taking medications this evening    03/05/20 2200  Psych Admission Type (Psych Patients Only)  Admission Status Involuntary  Psychosocial Assessment  Patient Complaints Suspiciousness  Eye Contact Brief;Fair  Facial Expression Anxious  Affect Anxious  Speech Incoherent;Slow  Interaction Cautious;Avoidant  Motor Activity Slow  Appearance/Hygiene Disheveled  Behavior Characteristics Cooperative  Mood Suspicious  Aggressive Behavior  Effect No apparent injury  Thought Process  Coherency Incoherent  Content WDL  Delusions WDL  Perception WDL  Hallucination UTA  Judgment Poor  Confusion WDL  Danger to Self  Current suicidal ideation? Denies  Danger to Others  Danger to Others None reported or observed

## 2020-03-05 NOTE — Progress Notes (Signed)
[facility] MD Progress Note  03/05/2020 10:35 AM Jackson Williams  MRN:  [id number] Subjective: Patient is a 24 year old male with a negative past psychiatric history who was admitted on 03/02/2020 secondary to paranoia.  According to the notes the patient had recently been treated with glucocorticoids for an allergic reaction on 6/5, and for the last 3 days has been paranoid.  Objective: Patient is seen and examined.  Patient is a 24 year old male with the above-stated past psychiatric history who is seen in follow-up.  Review of the electronic medical record was done.  Patient is not a great historian.  It looks like what precipitated the visit to the emergency department had to do with new onset paranoia.  In discussion with the patient today it seems to suggest that he really never took the steroids.  Review of his CBC reveals essentially normal CBC, and no elevation of white count or differential that he would have expected with glucocorticosteroids.  He also stated that he had been only sleeping 3 to 4 hours at night for several months.  He also displayed paranoia.  He talked about his fear of harm from others.  Again, the history is limited mainly because the patient's paranoia.  He apparently does speak [place] well.  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  He stated that he had gone to school in the past.  He had gone to [employer] and taking courses in Librarian, academic.  He was unable to explain why he stopped.  He lives with his mother and father.  He denied any drug usage or alcohol.  He denied any family history.  He denied any auditory or visual hallucinations.  He denied any suicidal or homicidal ideation.  His vital signs are stable, he is afebrile.  Nursing notes reflect he slept 7 hours last night.  Review of his admission laboratories revealed essentially normal electrolytes with a mildly elevated but normal creatinine at 1.24.  Liver function enzymes were normal.  His total cholesterol was  mildly elevated at 202.  CBC was normal.  Differential was normal.  His acetaminophen and salicylate were both negative.  Hemoglobin A1c was 5.5.  TSH was 1.520.  Drug screen was negative.  CT scan of the head was negative.  Principal Problem: Brief psychotic disorder ([place]) Diagnosis: Principal Problem:   Brief psychotic disorder ([place]) Active Problems:   Psychosis ([place])  Total Time spent with patient: 30 minutes  Past Psychiatric History: See admission H&P  Past Medical History: History reviewed. No pertinent past medical history. History reviewed. No pertinent surgical history. Family History: History reviewed. No pertinent family history. Family Psychiatric  History: See admission H&P Social History:  Social History   Substance and Sexual Activity  Alcohol Use No     Social History   Substance and Sexual Activity  Drug Use No    Social History   Socioeconomic History  . Marital status: Single    Spouse name: Not on file  . Number of children: Not on file  . Years of education: Not on file  . Highest education level: Not on file  Occupational History  . Not on file  Tobacco Use  . Smoking status: Never Smoker  . Smokeless tobacco: Never Used  Vaping Use  . Vaping Use: Never used  Substance and Sexual Activity  . Alcohol use: No  . Drug use: No  . Sexual activity: Not on file  Other Topics Concern  . Not on file  Social History  Narrative  . Not on file   Social Determinants of Health   Financial Resource Strain:   . Difficulty of Paying Living Expenses:   Food Insecurity:   . Worried About Programme researcher, broadcasting/film/video in the Last Year:   . Barista in the Last Year:   Transportation Needs:   . Freight forwarder (Medical):   Marland Kitchen Lack of Transportation (Non-Medical):   Physical Activity:   . Days of Exercise per Week:   . Minutes of Exercise per Session:   Stress:   . Feeling of Stress :   Social Connections:   . Frequency of Communication with Friends  and Family:   . Frequency of Social Gatherings with Friends and Family:   . Attends Religious Services:   . Active Member of Clubs or Organizations:   . Attends Banker Meetings:   Marland Kitchen Marital Status:    Additional Social History:                         Sleep: Fair  Appetite:  Fair  Current Medications: Current Facility-Administered Medications  Medication Dose Route Frequency Provider Last Rate Last Admin  . acetaminophen (TYLENOL) tablet 650 mg  650 mg Oral Q6H PRN Jackelyn Poling, NP      . alum & mag hydroxide-simeth (MAALOX/MYLANTA) 200-200-20 MG/5ML suspension 30 mL  30 mL Oral Q4H PRN Nira Conn A, NP      . hydrOXYzine (ATARAX/VISTARIL) tablet 25 mg  25 mg Oral TID PRN Nira Conn A, NP      . OLANZapine zydis (ZYPREXA) disintegrating tablet 5 mg  5 mg Oral Q8H PRN Cobos, Rockey Situ, MD       And  . LORazepam (ATIVAN) tablet 1 mg  1 mg Oral PRN Cobos, Rockey Situ, MD       And  . ziprasidone (GEODON) injection 20 mg  20 mg Intramuscular PRN Cobos, Fernando A, MD      . risperiDONE (RISPERDAL M-TABS) disintegrating tablet 2 mg  2 mg Oral Q8H PRN Jackelyn Poling, NP       And  . LORazepam (ATIVAN) tablet 1 mg  1 mg Oral PRN Nira Conn A, NP       And  . ziprasidone (GEODON) injection 20 mg  20 mg Intramuscular PRN Nira Conn A, NP      . magnesium hydroxide (MILK OF MAGNESIA) suspension 30 mL  30 mL Oral Daily PRN Jackelyn Poling, NP      . OLANZapine (ZYPREXA) tablet 5 mg  5 mg Oral QHS Cobos, Rockey Situ, MD   5 mg at 03/04/20 2104    Lab Results:  Results for orders placed or performed during the hospital encounter of 03/03/20 (from the past 48 hour(s))  Hemoglobin A1c     Status: None   Collection Time: 03/04/20  6:47 AM  Result Value Ref Range   Hgb A1c MFr Bld 5.5 4.8 - 5.6 %    Comment: (NOTE) Pre diabetes:          5.7%-6.4% Diabetes:              >6.4% Glycemic control for   <7.0% adults with diabetes    Mean Plasma Glucose 111.15  mg/dL    Comment: Performed at [facility], [address] [place], [place] [address]  Lipid panel     Status: Abnormal   Collection Time: 03/04/20  6:47 AM  Result Value Ref Range   Cholesterol 213 (H) 0 - 200 mg/dL   Triglycerides 562100 <130<150 mg/dL   HDL 62 >86>40 mg/dL   Total CHOL/HDL Ratio 3.4 RATIO   VLDL 20 0 - 40 mg/dL   LDL Cholesterol 578131 (H) 0 - 99 mg/dL    Comment:        Total Cholesterol/HDL:CHD Risk Coronary Heart Disease Risk Table                     Men   Women  1/2 Average Risk   3.4   3.3  Average Risk       5.0   4.4  2 X Average Risk   9.6   7.1  3 X Average Risk  23.4   11.0        Use the calculated Patient Ratio above and the CHD Risk Table to determine the patient's CHD Risk.        ATP III CLASSIFICATION (LDL):  <100     mg/dL   Optimal  [id number]  mg/dL   Near or Above                    Optimal  130-159  mg/dL   Borderline  [id number]  mg/dL   High  >244>190     mg/dL   Very High Performed at [facility], [address] [place], [place] [address]   Hemoglobin A1c     Status: None   Collection Time: 03/05/20  6:47 AM  Result Value Ref Range   Hgb A1c MFr Bld 5.5 4.8 - 5.6 %    Comment: (NOTE) Pre diabetes:          5.7%-6.4%  Diabetes:              >6.4%  Glycemic control for   <7.0% adults with diabetes    Mean Plasma Glucose 111.15 mg/dL    Comment: Performed at [facility], [address] [place], [place] [address]  Lipid panel     Status: Abnormal   Collection Time: 03/05/20  6:47 AM  Result Value Ref Range   Cholesterol 202 (H) 0 - 200 mg/dL   Triglycerides 89 <440<150 mg/dL   HDL 57 >34>40 mg/dL   Total CHOL/HDL Ratio 3.5 RATIO   VLDL 18 0 - 40 mg/dL   LDL Cholesterol 742127 (H) 0 - 99 mg/dL    Comment:        Total Cholesterol/HDL:CHD Risk Coronary Heart Disease Risk Table                     Men   Women  1/2 Average Risk   3.4   3.3  Average Risk       5.0   4.4  2 X Average Risk   9.6   7.1  3 X Average  Risk  23.4   11.0        Use the calculated Patient Ratio above and the CHD Risk Table to determine the patient's CHD Risk.        ATP III CLASSIFICATION (LDL):  <100     mg/dL   Optimal  [id number]  mg/dL   Near or Above                    Optimal  130-159  mg/dL   Borderline  756-433160-189  mg/dL   High  >295>190  mg/dL   Very High Performed at [facility], [address] [place], [place] [address]   TSH     Status: None   Collection Time: 03/05/20  6:47 AM  Result Value Ref Range   TSH 1.520 0.350 - 4.500 uIU/mL    Comment: Performed by a 3rd Generation assay with a functional sensitivity of <=0.01 uIU/mL. Performed at [facility], [address], [place], [place] [address]     Blood Alcohol level:  Lab Results  Component Value Date   ETH <10 03/02/2020    Metabolic Disorder Labs: Lab Results  Component Value Date   HGBA1C 5.5 03/05/2020   MPG 111.15 03/05/2020   MPG 111.15 03/04/2020   No results found for: PROLACTIN Lab Results  Component Value Date   CHOL 202 (H) 03/05/2020   TRIG 89 03/05/2020   HDL 57 03/05/2020   CHOLHDL 3.5 03/05/2020   VLDL 18 03/05/2020   LDLCALC 127 (H) 03/05/2020   LDLCALC 131 (H) 03/04/2020    Physical Findings: AIMS:  , ,  ,  ,    CIWA:    COWS:     Musculoskeletal: Strength & Muscle Tone: within normal limits Gait & Station: normal Patient leans: N/A  Psychiatric Specialty Exam: Physical Exam  Nursing note and vitals reviewed. Respiratory: Effort normal.  GI: Normal appearance.  Neurological: He is alert.    Review of Systems  Blood pressure (!) 122/94, pulse 90, temperature 98.3 F (36.8 C), temperature source Oral, resp. rate 18, height 5\' 7"  (1.702 m), weight 75.3 kg, SpO2 99 %.Body mass index is 26 kg/m.  General Appearance: Disheveled  Eye Contact:  Minimal  Speech:  Blocked and Slow  Volume:  Decreased  Mood:  Dysphoric  Affect:  Flat  Thought Process:  Goal Directed and  Descriptions of Associations: Circumstantial  Orientation:  Full (Time, Place, and Person)  Thought Content:  Delusions and Paranoid Ideation  Suicidal Thoughts:  No  Homicidal Thoughts:  No  Memory:  Immediate;   Poor Recent;   Poor Remote;   Poor  Judgement:  Impaired  Insight:  Fair  Psychomotor Activity:  Decreased  Concentration:  Concentration: Fair and Attention Span: Fair  Recall:  Poor  Fund of Knowledge:  Poor  Language:  Fair  Akathisia:  Negative  Handed:  Right  AIMS (if indicated):     Assets:  Desire for Improvement Resilience  ADL's:  Impaired  Cognition:  WNL  Sleep:  Number of Hours: 7     Treatment Plan Summary: Daily contact with patient to assess and evaluate symptoms and progress in treatment, Medication management and Plan : Patient is seen and examined.  Patient is a 24 year old male with the above-stated past psychiatric history who is seen in follow-up.   Diagnosis: 1.  New onset psychosis  Pertinent findings on examination today: 1.  Continued paranoid delusions 2.  No evidence of mania from the steroids. 3.  CBC and differential suggest that perhaps he did not take the steroids 4.  Patient admitted to at least a several week history of 3 to 4 hours of sleep at night. 5.  Patient admitted to some paranoia previously when he attended [facility].  Plan: 1.  Increase Zyprexa to 7.5 mg p.o. nightly for psychosis. 2.  Add Zoloft 25 mg p.o. daily and titrate for depression and anxiety with the chance that this is psychotic depression 3.  No other changes in his as needed medications  at this time. 4.  Collect additional history from stepfather. 5.  Disposition planning-in progress.  Antonieta Pert, MD 03/05/2020, 10:35 AM

## 2020-03-05 NOTE — Progress Notes (Signed)
Recreation Therapy Notes  INPATIENT RECREATION THERAPY ASSESSMENT  Patient Details Name: Jackson Williams MRN: [id number] DOB: [birth year] Today's Date: 03/05/2020       Information Obtained From: Patient  Able to Participate in Assessment/Interview: Yes  Patient Presentation: Alert  Reason for Admission (Per Patient): Other (Comments) (Pt stated his dad sent him here.)  Patient Stressors:  (None identified)  Coping Skills:   Sports, TV, Music, Exercise, Deep Breathing, Talk, Prayer, Hot Bath/Shower  Leisure Interests (2+):  Individual - Other (Comment) (Sleep, Eating, Relaxing)  Frequency of Recreation/Participation:  (Pt unable to explain)  Awareness of Community Resources:  Yes  Community Resources:  Park  Current Use: Yes  If no, Barriers?:    Expressed Interest in [employer] Information: No  County of Residence:  [place]  Patient Main Form of Transportation: Set designer  Patient Strengths:  Like to help others  Patient Identified Areas of Improvement:  "I don't know"  Patient Goal for Hospitalization:  "to get better"  Current SI (including self-harm):  No  Current HI:  No  Current AVH: No  Staff Intervention Plan: Group Attendance, Collaborate with Interdisciplinary Treatment Team  Consent to Intern Participation: N/A     Caroll Rancher, LRT/CTRS  Caroll Rancher A 03/05/2020, 12:08 PM

## 2020-03-06 MED ORDER — SERTRALINE HCL 50 MG PO TABS
50.0000 mg | ORAL_TABLET | Freq: Every day | ORAL | Status: DC
  Administered 2020-03-07 – 2020-03-09 (×3): 50 mg via ORAL
  Filled 2020-03-06 (×4): qty 1

## 2020-03-06 MED ORDER — OLANZAPINE 10 MG PO TABS
10.0000 mg | ORAL_TABLET | Freq: Every day | ORAL | Status: DC
  Administered 2020-03-06 – 2020-03-08 (×3): 10 mg via ORAL
  Filled 2020-03-06 (×4): qty 1

## 2020-03-06 NOTE — Progress Notes (Signed)
[facility] MD Progress Note  03/06/2020 10:23 AM Jackson Williams  MRN:  [id number] Subjective:  Patient is a 24 year old male with a negative past psychiatric history who was admitted on 03/02/2020 secondary to paranoia.  According to the notes the patient had recently been treated with glucocorticoids for an allergic reaction on 6/5, and for the last 3 days has been paranoid.  Objective: Patient is seen and examined. Patient is a 24 year old male with the above-stated past psychiatric history who is seen in follow-up. He is perhaps slightly better today he is a little bit more verbal. I asked him several questions about how long things have been going on, whether or not he has been having any depressive symptoms prior to the bee sting and the steroids. He still is not very clear on that. He did take his medications yesterday despite the fact that it was felt he still had paranoia. His blood pressures been slightly elevated. Initially it was 127/100, repeat was 129/85. Pulse was 76. He is afebrile. He slept 7 hours last night. He denied any suicidal or homicidal ideation. He denied any auditory or visual hallucinations. His lipids from 6/10 showed a mildly elevated cholesterol at 202. TSH was 1.520. Hemoglobin A1c was 5.5. We still do not have an EKG on this patient.  Principal Problem: Brief psychotic disorder ([place]) Diagnosis: Principal Problem:   Brief psychotic disorder ([place]) Active Problems:   Psychosis ([place])  Total Time spent with patient: 20 minutes  Past Psychiatric History: See admission H&P  Past Medical History: History reviewed. No pertinent past medical history. History reviewed. No pertinent surgical history. Family History: History reviewed. No pertinent family history. Family Psychiatric  History: See admission H&P Social History:  Social History   Substance and Sexual Activity  Alcohol Use No     Social History   Substance and Sexual Activity  Drug Use No    Social History    Socioeconomic History  . Marital status: Single    Spouse name: Not on file  . Number of children: Not on file  . Years of education: Not on file  . Highest education level: Not on file  Occupational History  . Not on file  Tobacco Use  . Smoking status: Never Smoker  . Smokeless tobacco: Never Used  Vaping Use  . Vaping Use: Never used  Substance and Sexual Activity  . Alcohol use: No  . Drug use: No  . Sexual activity: Not on file  Other Topics Concern  . Not on file  Social History Narrative  . Not on file   Social Determinants of Health   Financial Resource Strain:   . Difficulty of Paying Living Expenses:   Food Insecurity:   . Worried About Charity fundraiser in the Last Year:   . Arboriculturist in the Last Year:   Transportation Needs:   . Film/video editor (Medical):   Marland Kitchen Lack of Transportation (Non-Medical):   Physical Activity:   . Days of Exercise per Week:   . Minutes of Exercise per Session:   Stress:   . Feeling of Stress :   Social Connections:   . Frequency of Communication with Friends and Family:   . Frequency of Social Gatherings with Friends and Family:   . Attends Religious Services:   . Active Member of Clubs or Organizations:   . Attends Archivist Meetings:   Marland Kitchen Marital Status:    Additional Social History:  Sleep: Fair  Appetite:  Fair  Current Medications: Current Facility-Administered Medications  Medication Dose Route Frequency Provider Last Rate Last Admin  . acetaminophen (TYLENOL) tablet 650 mg  650 mg Oral Q6H PRN Jackelyn Poling, NP      . alum & mag hydroxide-simeth (MAALOX/MYLANTA) 200-200-20 MG/5ML suspension 30 mL  30 mL Oral Q4H PRN Nira Conn A, NP      . hydrOXYzine (ATARAX/VISTARIL) tablet 25 mg  25 mg Oral TID PRN Nira Conn A, NP      . OLANZapine zydis (ZYPREXA) disintegrating tablet 5 mg  5 mg Oral Q8H PRN Cobos, Rockey Situ, MD       And  . LORazepam (ATIVAN)  tablet 1 mg  1 mg Oral PRN Cobos, Rockey Situ, MD       And  . ziprasidone (GEODON) injection 20 mg  20 mg Intramuscular PRN Cobos, Fernando A, MD      . risperiDONE (RISPERDAL M-TABS) disintegrating tablet 2 mg  2 mg Oral Q8H PRN Jackelyn Poling, NP       And  . LORazepam (ATIVAN) tablet 1 mg  1 mg Oral PRN Nira Conn A, NP       And  . ziprasidone (GEODON) injection 20 mg  20 mg Intramuscular PRN Nira Conn A, NP      . magnesium hydroxide (MILK OF MAGNESIA) suspension 30 mL  30 mL Oral Daily PRN Jackelyn Poling, NP      . OLANZapine (ZYPREXA) tablet 10 mg  10 mg Oral QHS Antonieta Pert, MD      . Melene Muller ON 03/07/2020] sertraline (ZOLOFT) tablet 50 mg  50 mg Oral Daily Antonieta Pert, MD        Lab Results:  Results for orders placed or performed during the hospital encounter of 03/03/20 (from the past 48 hour(s))  Hemoglobin A1c     Status: None   Collection Time: 03/05/20  6:47 AM  Result Value Ref Range   Hgb A1c MFr Bld 5.5 4.8 - 5.6 %    Comment: (NOTE) Pre diabetes:          5.7%-6.4%  Diabetes:              >6.4%  Glycemic control for   <7.0% adults with diabetes    Mean Plasma Glucose 111.15 mg/dL    Comment: Performed at [facility], [address] [place], [place] [address]  Lipid panel     Status: Abnormal   Collection Time: 03/05/20  6:47 AM  Result Value Ref Range   Cholesterol 202 (H) 0 - 200 mg/dL   Triglycerides 89 <924 mg/dL   HDL 57 >26 mg/dL   Total CHOL/HDL Ratio 3.5 RATIO   VLDL 18 0 - 40 mg/dL   LDL Cholesterol 834 (H) 0 - 99 mg/dL    Comment:        Total Cholesterol/HDL:CHD Risk Coronary Heart Disease Risk Table                     Men   Women  1/2 Average Risk   3.4   3.3  Average Risk       5.0   4.4  2 X Average Risk   9.6   7.1  3 X Average Risk  23.4   11.0        Use the calculated Patient Ratio above and the CHD Risk Table to determine the patient's CHD Risk.  ATP III CLASSIFICATION (LDL):  <100     mg/dL    Optimal  726-203  mg/dL   Near or Above                    Optimal  130-159  mg/dL   Borderline  559-741  mg/dL   High  >638     mg/dL   Very High Performed at [facility], [address], [place], [place] [address]   TSH     Status: None   Collection Time: 03/05/20  6:47 AM  Result Value Ref Range   TSH 1.520 0.350 - 4.500 uIU/mL    Comment: Performed by a 3rd Generation assay with a functional sensitivity of <=0.01 uIU/mL. Performed at [facility], [address], [place], [place] [address]     Blood Alcohol level:  Lab Results  Component Value Date   ETH <10 03/02/2020    Metabolic Disorder Labs: Lab Results  Component Value Date   HGBA1C 5.5 03/05/2020   MPG 111.15 03/05/2020   MPG 111.15 03/04/2020   No results found for: PROLACTIN Lab Results  Component Value Date   CHOL 202 (H) 03/05/2020   TRIG 89 03/05/2020   HDL 57 03/05/2020   CHOLHDL 3.5 03/05/2020   VLDL 18 03/05/2020   LDLCALC 127 (H) 03/05/2020   LDLCALC 131 (H) 03/04/2020    Physical Findings: AIMS:  , ,  ,  ,    CIWA:    COWS:     Musculoskeletal: Strength & Muscle Tone: within normal limits Gait & Station: normal Patient leans: N/A  Psychiatric Specialty Exam: Physical Exam  Nursing note and vitals reviewed. HENT:  Head: Normocephalic and atraumatic.  Respiratory: Effort normal.  GI: Normal appearance.  Neurological: He is alert.    Review of Systems  Blood pressure 129/85, pulse 92, temperature 98.3 F (36.8 C), temperature source Oral, resp. rate 18, height 5\' 7"  (1.702 m), weight 75.3 kg, SpO2 99 %.Body mass index is 26 kg/m.  General Appearance: Disheveled  Eye Contact:  Fair  Speech:  Normal Rate  Volume:  Decreased  Mood:  Anxious, Depressed and Dysphoric  Affect:  Congruent  Thought Process:  Goal Directed and Descriptions of Associations: Circumstantial  Orientation:  Full (Time, Place, and Person)  Thought Content:  Delusions  and Paranoid Ideation  Suicidal Thoughts:  No  Homicidal Thoughts:  No  Memory:  Immediate;   Poor Recent;   Poor Remote;   Poor  Judgement:  Intact  Insight:  Fair  Psychomotor Activity:  Increased  Concentration:  Concentration: Fair and Attention Span: Fair  Recall:  of Knowledge:  Fair  Language:  Fair  Akathisia:  Negative  Handed:  Right  AIMS (if indicated):     Assets:  Desire for Improvement Resilience  ADL's:  Impaired  Cognition:  WNL  Sleep:  Number of Hours: 7     Treatment Plan Summary: Daily contact with patient to assess and evaluate symptoms and progress in treatment, Medication management and Plan : Patient is seen and examined. Patient is a 24 year old male with the above-stated past psychiatric history who is seen in follow-up.   Diagnosis: 1. New onset psychosis. Differential at this point includes steroid-induced psychosis versus major depression with psychotic features  Pertinent findings on examination today: 1. Still with paranoia and guarded behaviors. 2. Improved sleep 3. Some improvement in speech pattern and background.  Plan: 1. Increase Zyprexa to  10 mg p.o. nightly for psychosis and paranoia. 2. Increase Zoloft to 50 mg p.o. daily for anxiety and depression. 3. Continue hydroxyzine 25 mg p.o. 3 times daily as needed anxiety. 4. Continue agitation protocol as needed. 5. Get EKG. 6. Disposition planning-in progress.  Antonieta Pert, MD 03/06/2020, 10:23 AM

## 2020-03-06 NOTE — BHH Counselor (Signed)
CSW spoke with patients father, who reported that he realized after speaking to pt's gf, mother, and aunt that this pt was showing symptoms while he was still taking the steroids. He stated that the symptoms were more mild at the time and included not sleeping, being more withdrawn, lack of appetite, and he also told his girlfriend that he "didn't feel like himself."     Ruthann Cancer MSW, [employer] Clincal Social Worker  [facility]

## 2020-03-06 NOTE — Progress Notes (Signed)
Pt continues to be paranoid, but visible more on the milieu    03/06/20 2000  Psych Admission Type (Psych Patients Only)  Admission Status Involuntary  Psychosocial Assessment  Patient Complaints Suspiciousness  Eye Contact Brief;Fair  Facial Expression Anxious  Affect Anxious  Speech Incoherent;Slow  Interaction Cautious;Avoidant  Motor Activity Slow  Appearance/Hygiene Disheveled  Behavior Characteristics Cooperative;Appropriate to situation  Mood Suspicious  Aggressive Behavior  Effect No apparent injury  Thought Process  Coherency Incoherent  Content WDL  Delusions WDL  Perception WDL  Hallucination None reported or observed  Judgment Poor  Confusion WDL  Danger to Self  Current suicidal ideation? Denies  Danger to Others  Danger to Others None reported or observed

## 2020-03-06 NOTE — Progress Notes (Signed)
   03/05/20 2200  Psych Admission Type (Psych Patients Only)  Admission Status Involuntary  Psychosocial Assessment  Patient Complaints Suspiciousness  Eye Contact Brief;Fair  Facial Expression Anxious  Affect Anxious  Speech Incoherent;Slow  Interaction Cautious;Avoidant  Motor Activity Slow  Appearance/Hygiene Disheveled  Behavior Characteristics Cooperative  Mood Suspicious  Aggressive Behavior  Effect No apparent injury  Thought Process  Coherency Incoherent  Content WDL  Delusions WDL  Perception WDL  Hallucination UTA  Judgment Poor  Confusion WDL  Danger to Self  Current suicidal ideation? Denies  Danger to Others  Danger to Others None reported or observed

## 2020-03-07 NOTE — Progress Notes (Signed)
Patient in room laying in bed this evening.  Presents guarded with sullen affect, soft and logical speech, ambulatory with a steady gait. Denies SI, HI, AVH and pain at this time. Pleasant and cooperative on approach. Compliant with medications this eveing. Denies any issues or concerns. Pt remains on Q 15 minutes safety checks and without self harm gestures. Writer encouraged pt to voice concerns. Pt remains safe on unit.

## 2020-03-07 NOTE — Progress Notes (Signed)
[facility] MD Progress Note  03/07/2020 11:34 AM Jackson Williams  MRN:  [id number]   Subjective: Follow-up for this 24 year old male diagnosed with brief psychotic disorder.  Patient reports today that he is feeling little bit better but states that he feels a little drowsy this morning from the increased dose of medication last night.  Today he is denying having any suicidal homicidal ideations and denies any hallucinations.  Patient does admit to having some minor paranoia of feeling like someone may be out to get him but states that he still feels safe here at the hospital.  He reports that he is sleeping very well he has been having a good appetite and eating well.  He currently has no complaints at this time except for some drowsiness in the morning.  Principal Problem: Brief psychotic disorder (HCC) Diagnosis: Principal Problem:   Brief psychotic disorder (HCC) Active Problems:   Psychosis (HCC)  Total Time spent with patient: 20 minutes  Past Psychiatric History:  No prior psychiatric admissions. As per stepfather, patient has no prior history of psychosis. No prior history of severe depressive episodes or of mania/hypomania.   He has not been on psychiatric medications in the past . No history of suicide attempts   Past Medical History: History reviewed. No pertinent past medical history. History reviewed. No pertinent surgical history. Family History: History reviewed. No pertinent family history. Family Psychiatric  History: None reported Social History:  Social History   Substance and Sexual Activity  Alcohol Use No     Social History   Substance and Sexual Activity  Drug Use No    Social History   Socioeconomic History  . Marital status: Single    Spouse name: Not on file  . Number of children: Not on file  . Years of education: Not on file  . Highest education level: Not on file  Occupational History  . Not on file  Tobacco Use  . Smoking status: Never Smoker  .  Smokeless tobacco: Never Used  Vaping Use  . Vaping Use: Never used  Substance and Sexual Activity  . Alcohol use: No  . Drug use: No  . Sexual activity: Not on file  Other Topics Concern  . Not on file  Social History Narrative  . Not on file   Social Determinants of Health   Financial Resource Strain:   . Difficulty of Paying Living Expenses:   Food Insecurity:   . Worried About Programme researcher, broadcasting/film/video in the Last Year:   . Barista in the Last Year:   Transportation Needs:   . Freight forwarder (Medical):   Marland Kitchen Lack of Transportation (Non-Medical):   Physical Activity:   . Days of Exercise per Week:   . Minutes of Exercise per Session:   Stress:   . Feeling of Stress :   Social Connections:   . Frequency of Communication with Friends and Family:   . Frequency of Social Gatherings with Friends and Family:   . Attends Religious Services:   . Active Member of Clubs or Organizations:   . Attends Banker Meetings:   Marland Kitchen Marital Status:    Additional Social History:                         Sleep: Good  Appetite:  Good  Current Medications: Current Facility-Administered Medications  Medication Dose Route Frequency Provider Last Rate Last Admin  . acetaminophen (TYLENOL) tablet 650  mg  650 mg Oral Q6H PRN Jackelyn Poling, NP      . alum & mag hydroxide-simeth (MAALOX/MYLANTA) 200-200-20 MG/5ML suspension 30 mL  30 mL Oral Q4H PRN Nira Conn A, NP      . hydrOXYzine (ATARAX/VISTARIL) tablet 25 mg  25 mg Oral TID PRN Nira Conn A, NP      . OLANZapine zydis (ZYPREXA) disintegrating tablet 5 mg  5 mg Oral Q8H PRN Cobos, Rockey Situ, MD       And  . LORazepam (ATIVAN) tablet 1 mg  1 mg Oral PRN Cobos, Rockey Situ, MD       And  . ziprasidone (GEODON) injection 20 mg  20 mg Intramuscular PRN Cobos, Fernando A, MD      . magnesium hydroxide (MILK OF MAGNESIA) suspension 30 mL  30 mL Oral Daily PRN Nira Conn A, NP      . OLANZapine (ZYPREXA)  tablet 10 mg  10 mg Oral QHS Antonieta Pert, MD   10 mg at 03/06/20 2027  . sertraline (ZOLOFT) tablet 50 mg  50 mg Oral Daily Antonieta Pert, MD   50 mg at 03/07/20 0830    Lab Results: No results found for this or any previous visit (from the past 48 hour(s)).  Blood Alcohol level:  Lab Results  Component Value Date   ETH <10 03/02/2020    Metabolic Disorder Labs: Lab Results  Component Value Date   HGBA1C 5.5 03/05/2020   MPG 111.15 03/05/2020   MPG 111.15 03/04/2020   No results found for: PROLACTIN Lab Results  Component Value Date   CHOL 202 (H) 03/05/2020   TRIG 89 03/05/2020   HDL 57 03/05/2020   CHOLHDL 3.5 03/05/2020   VLDL 18 03/05/2020   LDLCALC 127 (H) 03/05/2020   LDLCALC 131 (H) 03/04/2020    Physical Findings: AIMS:  , ,  ,  ,    CIWA:    COWS:     Musculoskeletal: Strength & Muscle Tone: within normal limits Gait & Station: normal Patient leans: N/A  Psychiatric Specialty Exam: Physical Exam  Review of Systems  Blood pressure 124/84, pulse 91, temperature 98.5 F (36.9 C), temperature source Oral, resp. rate 18, height 5\' 7"  (1.702 m), weight 75.3 kg, SpO2 99 %.Body mass index is 26 kg/m.  General Appearance: Disheveled  Eye Contact:  Good  Speech:  Clear and Coherent and Normal Rate  Volume:  Decreased  Mood:  Anxious  Affect:  Flat  Thought Process:  Coherent and Descriptions of Associations: Circumstantial  Orientation:  Full (Time, Place, and Person)  Thought Content:  Paranoid Ideation  Suicidal Thoughts:  No  Homicidal Thoughts:  No  Memory:  Immediate;   Fair Recent;   Fair Remote;   Fair  Judgement:  Fair  Insight:  Fair  Psychomotor Activity:  Normal  Concentration:  Concentration: Fair  Recall:  of Knowledge:  Fair  Language:  Fair  Akathisia:  No  Handed:  Right  AIMS (if indicated):     Assets:  Desire for Improvement Housing Physical Health Social Support  ADL's:  Intact  Cognition:  WNL   Sleep:  Number of Hours: 8   Assessment: Patient does appear to be showing some improvement with decreased paranoia.  He is continued to deny any suicidal homicidal ideations and denies any hallucinations.  Patient continues to have flat affect as well as decreased speech volume but otherwise is calm, cooperative, and pleasant.  Patient has been seen in the day room and has not been seen interacting much with other peers.  Patient's medications were just increased yesterday and will continue current medications.  Patient's blood pressure is WNL at 124/84 as well as the rest of the vital signs are WNL.  There are no new labs to report today.  Treatment Plan Summary: Daily contact with patient to assess and evaluate symptoms and progress in treatment and Medication management Continue Vistaril 25 mg p.o. 3 times daily as needed for anxiety Continue Zyprexa 10 mg p.o. nightly for psychosis Continue Zoloft 50 mg p.o. daily for depression Continue agitation protocol of Zyprexa Zydis, Ativan, and Geodon Encourage group therapy participation Continue every 15 minute safety checks  Lewis Shock, FNP 03/07/2020, 11:34 AM

## 2020-03-07 NOTE — Progress Notes (Signed)
   03/07/20 1500  Psych Admission Type (Psych Patients Only)  Admission Status Involuntary  Psychosocial Assessment  Patient Complaints Anxiety;Depression  Eye Contact Brief  Facial Expression Anxious  Affect Anxious  Speech Logical/coherent  Interaction Cautious  Motor Activity Other (Comment) (WDL)  Appearance/Hygiene Improved  Behavior Characteristics Cooperative  Mood Suspicious  Aggressive Behavior  Effect No apparent injury  Thought Process  Coherency WDL  Content WDL  Delusions None reported or observed  Perception WDL  Hallucination None reported or observed  Judgment Poor  Confusion None  Danger to Self  Current suicidal ideation? Passive  Self-Injurious Behavior No self-injurious ideation or behavior indicators observed or expressed   Agreement Not to Harm Self Yes  Description of Agreement verbal contract  Danger to Others  Danger to Others None reported or observed   D:Pt reports he is less anxious and has less worrying thoughts. He rates depression and anxiety as a 2 on 0-10 scale with 10 being the most. He reported passive si thoughts this morning and contracted with staff for safety. A:Offered support, encouragement and 15 minute checks. R:Safety maintained on the unit.

## 2020-03-07 NOTE — [facility] Group Notes (Signed)
Adult Psychoeducational Group Note  Date:  03/07/2020 Time:  11:03 AM  Group Topic/Focus:  Goals Group:   The focus of this group is to help patients establish daily goals to achieve during treatment and discuss how the patient can incorporate goal setting into their daily lives to aide in recovery.  Participation Level:  Active  Participation Quality:  Appropriate  Affect:  Blunted  Cognitive:  Oriented  Insight: Lacking  Engagement in Group:  Engaged  Modes of Intervention:  Activity  Additional Comments:  Pt's goal is to speak with his step father today.   Vira Blanco A 03/07/2020, 11:03 AM

## 2020-03-07 NOTE — Progress Notes (Signed)
EKG completed, showed to Dr Jama Flavors and placed in chart.

## 2020-03-07 NOTE — [facility] Group Notes (Signed)
°  [facility] LCSW Group Therapy Note  Date/Time:  03/07/2020 11:15AM-12:00PM  Type of Therapy and Topic:  Group Therapy:  Feelings About Hospitalization  Participation Level:  Active   Description of Group This process group involved patients discussing their feelings related to being hospitalized, as well as the benefits they see to being in the hospital.  These feelings and benefits were itemized.  The group then brainstormed specific ways in which they could seek those same benefits when they discharge and return home.  Therapeutic Goals 1. Patient will identify and describe positive and negative feelings related to hospitalization 2. Patient will verbalize benefits of hospitalization to themselves personally 3. Patients will brainstorm together ways they can obtain similar benefits in the outpatient setting, identify barriers to wellness and possible solutions  Summary of Patient Progress:  The patient expressed his primary feelings about being hospitalized are that it is good because he has been treated well, but it is bad because he is lonely.  His lack of English language skills makes him feel somewhat isolated.  He stated when he gets out of the hospital he will eat healthy food, exercise, and more than anything will maintain a routine that will help him in staying well.  He stated he is in the hospital with depression and anxiety, saying that his father wanted him to come in.  He stated he has depression and anxiety issues, and thinks that sometimes social media makes these things worse.  What makes him happy is being with people he loves, exercising, playing games on his computer, and listening to music.  Therapeutic Modalities Cognitive Behavioral Therapy Motivational Interviewing    Ambrose Mantle, LCSW 03/07/2020, 11:58 AM

## 2020-03-08 NOTE — [facility] Group Notes (Signed)
Adult Psychoeducational Group Note  Date:  03/08/2020  Time:  9:00am-9:45am  Group Topic/Focus: PROGRESSIVE RELAXATION. A group where deep breathing is taught and tensing and relaxation muscle groups is used. Imagery is used as well.  Pts are asked to imagine 3 pillars that hold them up when they are not able to hold themselves up.  Participation Level:  Active  Participation Quality:  Appropriate  Affect:  Flat  Cognitive:  Oriented  Insight: Lacking  Engagement in Group:  Engaged  Modes of Intervention:  Activity, Discussion, Education, and Support  Additional Comments:  Pt rates his energy as a 7/10. States his parents, folks who work here, are his pillars.  Vira Blanco A 1:01 PM

## 2020-03-08 NOTE — Progress Notes (Signed)
   03/08/20 2137  COVID-19 Daily Checkoff  Have you had a fever (temp > 37.80C/100F)  in the past 24 hours?  No  If you have had runny nose, nasal congestion, sneezing in the past 24 hours, has it worsened? No  COVID-19 EXPOSURE  Have you traveled outside the state in the past 14 days? No  Have you been in contact with someone with a confirmed diagnosis of COVID-19 or PUI in the past 14 days without wearing appropriate PPE? No  Have you been living in the same home as a person with confirmed diagnosis of COVID-19 or a PUI (household contact)? No  Have you been diagnosed with COVID-19? No

## 2020-03-08 NOTE — Plan of Care (Signed)
Progress note  D: pt found in bed; compliant with medication administration. Pt continues to be guarded and minimal on assessment. Pt also continues to isolate to their room. Pt is pleasant though. Pt denies si/hi/ah/vh and verbally agrees to approach staff if these become apparent or before harming themself/others while at bhh.  A: Pt provided support and encouragement. Pt given medication per protocol and standing orders. Q51m safety checks implemented and continued.  R: Pt safe on the unit. Will continue to monitor.  Pt progressing in the following metrics  Problem: Education: Goal: Knowledge of [facility] Education information/materials will improve Outcome: Progressing   Problem: Health Behavior/Discharge Planning: Goal: Compliance with prescribed medication regimen will improve Outcome: Progressing   Problem: Nutritional: Goal: Ability to achieve adequate nutritional intake will improve Outcome: Progressing   Problem: Role Relationship: Goal: Ability to communicate needs accurately will improve Outcome: Progressing

## 2020-03-08 NOTE — Progress Notes (Signed)
[facility] MD Progress Note  03/08/2020 11:39 AM Jackson Williams  MRN:  [id number]   Subjective: Follow-up for this 24 year old male diagnosed with brief psychotic disorder.  Patient states that he is doing very well today.  He reports that he is a little sleepy this morning but slept extremely well last night.  He denies any suicidal or homicidal ideations and denies any hallucinations.  Patient denies having any type of paranoia today as well.  He states he has been speaking with his father and he reports that his father thinks he sounds much better is just waiting on the phone call for him to discharge.  Patient states that he feels that he is ready to go home and that he plans to continue his current medications when he is at home.  He reports that he has been eating well and has no complaints at this time.  Principal Problem: Brief psychotic disorder ([place]) Diagnosis: Principal Problem:   Brief psychotic disorder ([place]) Active Problems:   Psychosis ([place])  Total Time spent with patient: 20 minutes  Past Psychiatric History:  No prior psychiatric admissions. As per stepfather, patient has no prior history of psychosis. No prior history of severe depressive episodes or of mania/hypomania.   He has not been on psychiatric medications in the past . No history of suicide attempts   Past Medical History: History reviewed. No pertinent past medical history. History reviewed. No pertinent surgical history. Family History: History reviewed. No pertinent family history. Family Psychiatric  History: None reported Social History:  Social History   Substance and Sexual Activity  Alcohol Use No     Social History   Substance and Sexual Activity  Drug Use No    Social History   Socioeconomic History  . Marital status: Single    Spouse name: Not on file  . Number of children: Not on file  . Years of education: Not on file  . Highest education level: Not on file  Occupational History  . Not on  file  Tobacco Use  . Smoking status: Never Smoker  . Smokeless tobacco: Never Used  Vaping Use  . Vaping Use: Never used  Substance and Sexual Activity  . Alcohol use: No  . Drug use: No  . Sexual activity: Not on file  Other Topics Concern  . Not on file  Social History Narrative  . Not on file   Social Determinants of Health   Financial Resource Strain:   . Difficulty of Paying Living Expenses:   Food Insecurity:   . Worried About Charity fundraiser in the Last Year:   . Arboriculturist in the Last Year:   Transportation Needs:   . Film/video editor (Medical):   Marland Kitchen Lack of Transportation (Non-Medical):   Physical Activity:   . Days of Exercise per Week:   . Minutes of Exercise per Session:   Stress:   . Feeling of Stress :   Social Connections:   . Frequency of Communication with Friends and Family:   . Frequency of Social Gatherings with Friends and Family:   . Attends Religious Services:   . Active Member of Clubs or Organizations:   . Attends Archivist Meetings:   Marland Kitchen Marital Status:    Additional Social History:                         Sleep: Good  Appetite:  Good  Current Medications: Current Facility-Administered  Medications  Medication Dose Route Frequency Provider Last Rate Last Admin  . acetaminophen (TYLENOL) tablet 650 mg  650 mg Oral Q6H PRN Jackelyn Poling, NP      . alum & mag hydroxide-simeth (MAALOX/MYLANTA) 200-200-20 MG/5ML suspension 30 mL  30 mL Oral Q4H PRN Nira Conn A, NP      . hydrOXYzine (ATARAX/VISTARIL) tablet 25 mg  25 mg Oral TID PRN Nira Conn A, NP      . OLANZapine zydis (ZYPREXA) disintegrating tablet 5 mg  5 mg Oral Q8H PRN Cobos, Rockey Situ, MD       And  . LORazepam (ATIVAN) tablet 1 mg  1 mg Oral PRN Cobos, Rockey Situ, MD       And  . ziprasidone (GEODON) injection 20 mg  20 mg Intramuscular PRN Cobos, Fernando A, MD      . magnesium hydroxide (MILK OF MAGNESIA) suspension 30 mL  30 mL Oral  Daily PRN Nira Conn A, NP      . OLANZapine (ZYPREXA) tablet 10 mg  10 mg Oral QHS Antonieta Pert, MD   10 mg at 03/07/20 2008  . sertraline (ZOLOFT) tablet 50 mg  50 mg Oral Daily Antonieta Pert, MD   50 mg at 03/08/20 1610    Lab Results: No results found for this or any previous visit (from the past 48 hour(s)).  Blood Alcohol level:  Lab Results  Component Value Date   ETH <10 03/02/2020    Metabolic Disorder Labs: Lab Results  Component Value Date   HGBA1C 5.5 03/05/2020   MPG 111.15 03/05/2020   MPG 111.15 03/04/2020   No results found for: PROLACTIN Lab Results  Component Value Date   CHOL 202 (H) 03/05/2020   TRIG 89 03/05/2020   HDL 57 03/05/2020   CHOLHDL 3.5 03/05/2020   VLDL 18 03/05/2020   LDLCALC 127 (H) 03/05/2020   LDLCALC 131 (H) 03/04/2020    Physical Findings: AIMS: Facial and Oral Movements Muscles of Facial Expression: None, normal Lips and Perioral Area: None, normal Jaw: None, normal Tongue: None, normal,Extremity Movements Upper (arms, wrists, hands, fingers): None, normal Lower (legs, knees, ankles, toes): None, normal, Trunk Movements Neck, shoulders, hips: None, normal, Overall Severity Severity of abnormal movements (highest score from questions above): None, normal Incapacitation due to abnormal movements: None, normal Patient's awareness of abnormal movements (rate only patient's report): No Awareness, Dental Status Current problems with teeth and/or dentures?: No Does patient usually wear dentures?: No  CIWA:    COWS:     Musculoskeletal: Strength & Muscle Tone: within normal limits Gait & Station: normal Patient leans: N/A  Psychiatric Specialty Exam: Physical Exam  Nursing note and vitals reviewed. Constitutional: He is oriented to person, place, and time. He appears well-developed.  Cardiovascular: Normal rate.  Respiratory: Effort normal.  Musculoskeletal:        General: Normal range of motion.  Neurological:  He is alert and oriented to person, place, and time.  Skin: Skin is warm.    Review of Systems  Constitutional: Negative.   HENT: Negative.   Eyes: Negative.   Respiratory: Negative.   Cardiovascular: Negative.   Gastrointestinal: Negative.   Genitourinary: Negative.   Musculoskeletal: Negative.   Skin: Negative.   Neurological: Negative.   Psychiatric/Behavioral: Negative.     Blood pressure (!) 141/78, pulse 96, temperature 98.1 F (36.7 C), temperature source Oral, resp. rate 18, height 5\' 7"  (1.702 m), weight 75.3 kg, SpO2 99 %.Body mass index  is 26 kg/m.  General Appearance: Casual  Eye Contact:  Good  Speech:  Clear and Coherent and Normal Rate  Volume:  Decreased  Mood:  Euthymic  Affect:  Congruent  Thought Process:  Coherent and Descriptions of Associations: Circumstantial  Orientation:  Full (Time, Place, and Person)  Thought Content:  WDL  Suicidal Thoughts:  No  Homicidal Thoughts:  No  Memory:  Immediate;   Fair Recent;   Fair Remote;   Fair  Judgement:  Fair  Insight:  Fair  Psychomotor Activity:  Normal  Concentration:  Concentration: Fair  Recall:  [employer] of Knowledge:  Fair  Language:  Fair  Akathisia:  No  Handed:  Right  AIMS (if indicated):     Assets:  Desire for Improvement Housing Physical Health Social Support  ADL's:  Intact  Cognition:  WNL  Sleep:  Number of Hours: 6.25   Assessment: Patient presents in his room sitting on the bed and is pleasant, calm, cooperative.  He has congruent affect and is pleasant during evaluation.  Patient has been compliant with medications and I have seen him attending groups.  Patient has been interacting minimally with peers and staff on the unit but suspect that this is mainly due to language barrier.  We suspect the patient should be ready for discharge by tomorrow with collateral gained from patient's stepfather.  Patient's blood pressure is a little elevated today at 141/78, but all other vitals are  normal.  EKG reviewed from 03/07/2020 shows QTC of 431.  Treatment Plan Summary: Daily contact with patient to assess and evaluate symptoms and progress in treatment and Medication management Continue Vistaril 25 mg p.o. 3 times daily as needed for anxiety Continue Zyprexa 10 mg p.o. nightly for psychosis Continue Zoloft 50 mg p.o. daily for depression Continue agitation protocol of Zyprexa Zydis, Ativan, and Geodon Encourage group therapy participation Continue every 15 minute safety checks  Maryfrances Bunnell, FNP 03/08/2020, 11:39 AM

## 2020-03-08 NOTE — [facility] Group Notes (Signed)
[facility] LCSW Group Therapy Note  Date/Time:  03/08/2020  11:00AM-12:00PM  Type of Therapy and Topic:  Group Therapy:  Music and Mood  Participation Level:  Active   Description of Group: In this process group, members listened to a variety of genres of music and identified that different types of music evoke different responses.  Patients were encouraged to identify music that was soothing for them and music that was energizing for them.  Patients discussed how this knowledge can help with wellness and recovery in various ways including managing depression and anxiety as well as encouraging healthy sleep habits.    Therapeutic Goals: Patients will explore the impact of different varieties of music on mood Patients will verbalize the thoughts they have when listening to different types of music Patients will identify music that is soothing to them as well as music that is energizing to them Patients will discuss how to use this knowledge to assist in maintaining wellness and recovery Patients will explore the use of music as a coping skill  Summary of Patient Progress:  At the beginning of group, patient expressed that he felt a little sleepy.  At the end of group, patient expressed that he felt it was good to have listened to the music, relaxed, danced a little, and felt inspired.    Therapeutic Modalities: Solution Focused Brief Therapy Activity   Ambrose Mantle, LCSW

## 2020-03-08 NOTE — Progress Notes (Signed)
   03/08/20 2139  Psych Admission Type (Psych Patients Only)  Admission Status Involuntary  Psychosocial Assessment  Patient Complaints None  Eye Contact Brief  Facial Expression Pensive  Affect Appropriate to circumstance  Speech Logical/coherent  Interaction Forwards little;Minimal  Motor Activity Other (Comment) (WDL)  Appearance/Hygiene In scrubs  Behavior Characteristics Appropriate to situation  Mood Pleasant  Thought Process  Coherency WDL  Content WDL  Delusions None reported or observed  Perception WDL  Hallucination None reported or observed  Judgment WDL  Confusion None  Danger to Self  Current suicidal ideation? Denies  Self-Injurious Behavior No self-injurious ideation or behavior indicators observed or expressed   Agreement Not to Harm Self Yes  Description of Agreement verbal contract  Danger to Others  Danger to Others None reported or observed

## 2020-03-09 MED ORDER — SERTRALINE HCL 50 MG PO TABS: 50.0000 mg | ORAL_TABLET | Freq: Every day | ORAL | 0 refills | Status: DC

## 2020-03-09 MED ORDER — OLANZAPINE 10 MG PO TABS: 10.0000 mg | ORAL_TABLET | Freq: Every day | ORAL | 0 refills | Status: DC

## 2020-03-09 NOTE — Tx Team (Signed)
Interdisciplinary Treatment and Diagnostic Plan Update  03/09/2020 Time of Session: 9:00am Jackson Williams MRN: [id number]  Principal Diagnosis: Brief psychotic disorder (HCC)  Secondary Diagnoses: Principal Problem:   Brief psychotic disorder (HCC) Active Problems:   Psychosis (HCC)   Current Medications:  Current Facility-Administered Medications  Medication Dose Route Frequency Provider Last Rate Last Admin  . acetaminophen (TYLENOL) tablet 650 mg  650 mg Oral Q6H PRN Jackelyn Poling, NP      . alum & mag hydroxide-simeth (MAALOX/MYLANTA) 200-200-20 MG/5ML suspension 30 mL  30 mL Oral Q4H PRN Nira Conn A, NP      . hydrOXYzine (ATARAX/VISTARIL) tablet 25 mg  25 mg Oral TID PRN Nira Conn A, NP      . OLANZapine zydis (ZYPREXA) disintegrating tablet 5 mg  5 mg Oral Q8H PRN Cobos, Rockey Situ, MD       And  . LORazepam (ATIVAN) tablet 1 mg  1 mg Oral PRN Cobos, Rockey Situ, MD       And  . ziprasidone (GEODON) injection 20 mg  20 mg Intramuscular PRN Cobos, Fernando A, MD      . magnesium hydroxide (MILK OF MAGNESIA) suspension 30 mL  30 mL Oral Daily PRN Nira Conn A, NP      . OLANZapine (ZYPREXA) tablet 10 mg  10 mg Oral QHS Antonieta Pert, MD   10 mg at 03/08/20 1959  . sertraline (ZOLOFT) tablet 50 mg  50 mg Oral Daily Antonieta Pert, MD   50 mg at 03/09/20 0813   PTA Medications: Medications Prior to Admission  Medication Sig Dispense Refill Last Dose  . diphenhydrAMINE HCl (BENADRYL ALLERGY PO) Take 1 tablet by mouth daily as needed (for allergy, itching).      . diphenhydrAMINE-zinc acetate (BENADRYL) cream Apply 1 application topically 3 (three) times daily as needed for itching.     Marland Kitchen EPINEPHrine 0.3 mg/0.3 mL IJ SOAJ injection Inject 0.3 mLs (0.3 mg total) into the muscle as needed for anaphylaxis. 1 each 0   . hydrOXYzine (ATARAX/VISTARIL) 25 MG tablet Take 1 tablet (25 mg total) by mouth every 6 (six) hours as needed for anxiety, itching or nausea.  (Patient not taking: Reported on 03/02/2020) 12 tablet 0     Patient Stressors: Health problems Occupational concerns  Patient Strengths: Ability for insight Average or above average intelligence Supportive family/friends  Treatment Modalities: Medication Management, Group therapy, Case management,  1 to 1 session with clinician, Psychoeducation, Recreational therapy.   Physician Treatment Plan for Primary Diagnosis: Brief psychotic disorder [facility]) Long Term Goal(s): Improvement in symptoms so as ready for discharge Improvement in symptoms so as ready for discharge   Short Term Goals: Ability to identify changes in lifestyle to reduce recurrence of condition will improve Ability to verbalize feelings will improve Ability to disclose and discuss suicidal ideas Ability to demonstrate self-control will improve Ability to identify and develop effective coping behaviors will improve Ability to maintain clinical measurements within normal limits will improve Compliance with prescribed medications will improve Ability to identify changes in lifestyle to reduce recurrence of condition will improve Ability to verbalize feelings will improve Ability to disclose and discuss suicidal ideas Ability to demonstrate self-control will improve Ability to identify and develop effective coping behaviors will improve Ability to maintain clinical measurements within normal limits will improve Compliance with prescribed medications will improve  Medication Management: Evaluate patient's response, side effects, and tolerance of medication regimen.  Therapeutic Interventions: 1 to 1 sessions, Unit  Group sessions and Medication administration.  Evaluation of Outcomes: Adequate for Discharge  Physician Treatment Plan for Secondary Diagnosis: Principal Problem:   Brief psychotic disorder (HCC) Active Problems:   Psychosis (HCC)  Long Term Goal(s): Improvement in symptoms so as ready for  discharge Improvement in symptoms so as ready for discharge   Short Term Goals: Ability to identify changes in lifestyle to reduce recurrence of condition will improve Ability to verbalize feelings will improve Ability to disclose and discuss suicidal ideas Ability to demonstrate self-control will improve Ability to identify and develop effective coping behaviors will improve Ability to maintain clinical measurements within normal limits will improve Compliance with prescribed medications will improve Ability to identify changes in lifestyle to reduce recurrence of condition will improve Ability to verbalize feelings will improve Ability to disclose and discuss suicidal ideas Ability to demonstrate self-control will improve Ability to identify and develop effective coping behaviors will improve Ability to maintain clinical measurements within normal limits will improve Compliance with prescribed medications will improve     Medication Management: Evaluate patient's response, side effects, and tolerance of medication regimen.  Therapeutic Interventions: 1 to 1 sessions, Unit Group sessions and Medication administration.  Evaluation of Outcomes: Adequate for Discharge   RN Treatment Plan for Primary Diagnosis: Brief psychotic disorder (HCC) Long Term Goal(s): Knowledge of disease and therapeutic regimen to maintain health will improve  Short Term Goals: Ability to identify and develop effective coping behaviors will improve and Compliance with prescribed medications will improve  Medication Management: RN will administer medications as ordered by provider, will assess and evaluate patient's response and provide education to patient for prescribed medication. RN will report any adverse and/or side effects to prescribing provider.  Therapeutic Interventions: 1 on 1 counseling sessions, Psychoeducation, Medication administration, Evaluate responses to treatment, Monitor vital signs and  CBGs as ordered, Perform/monitor CIWA, COWS, AIMS and Fall Risk screenings as ordered, Perform wound care treatments as ordered.  Evaluation of Outcomes: Adequate for Discharge   LCSW Treatment Plan for Primary Diagnosis: Brief psychotic disorder [facility]) Long Term Goal(s): Safe transition to appropriate next level of care at discharge, Engage patient in therapeutic group addressing interpersonal concerns.  Short Term Goals: Facilitate acceptance of mental health diagnosis and concerns, Identify triggers associated with mental health/substance abuse issues and Increase skills for wellness and recovery  Therapeutic Interventions: Assess for all discharge needs, 1 to 1 time with Social worker, Explore available resources and support systems, Assess for adequacy in community support network, Educate family and significant other(s) on suicide prevention, Complete Psychosocial Assessment, Interpersonal group therapy.  Evaluation of Outcomes: Adequate for Discharge   Progress in Treatment: Attending groups: Yes. Participating in groups: Yes. Taking medication as prescribed: Yes. Toleration medication: Yes. Family/Significant other contact made: Yes, individual(s) contacted:  Step father Patient understands diagnosis: Yes. Discussing patient identified problems/goals with staff: Yes. Medical problems stabilized or resolved: Yes. Denies suicidal/homicidal ideation: Yes. Issues/concerns per patient self-inventory: No.  New problem(s) identified: None  New Short Term/Long Term Goal(s): medication management for mood stabilization; elimination of SI thoughts; development of comprehensive mental wellness/sobriety plan.  Patient Goals:    Discharge Plan or Barriers: Will be referred to [facility] for medication management and therapy.  Reason for Continuation of Hospitalization: Medication stabilization  Estimated Length of Stay: Patient is adequate for discharge  Attendees: Patient: 03/09/2020    Physician:  03/09/2020   Nursing:  03/09/2020   RN Care Manager: 03/09/2020   Social Worker: Ruthann Cancer, LCSWA 03/09/2020  Recreational Therapist:  03/09/2020   Other:  03/09/2020   Other:  03/09/2020   Other: 03/09/2020     Scribe for Treatment Team: Vassie Moselle, [place] 03/09/2020 9:41 AM

## 2020-03-09 NOTE — Progress Notes (Signed)
Pt discharged to lobby. Pt was stable and appreciative at that time. All papers, samples and prescriptions were given and valuables returned. Verbal understanding expressed. Denies SI/HI and A/VH. Pt given opportunity to express concerns and ask questions.  

## 2020-03-09 NOTE — Progress Notes (Signed)
  [facility] Adult Case Management Discharge Plan :  Will you be returning to the same living situation after discharge:  No. Will be returning to stay with parents.  At discharge, do you have transportation home?: Yes,  father will be picking pt up. Do you have the ability to pay for your medications: No. Samples will be provided at discharge.   Release of information consent forms completed and in the chart;  Patient's signature needed at discharge.  Patient to Follow up at:  Follow-up Information    [facility]. Go on 04/07/2020.   Specialty: Behavioral Health Why: You have an appointment on 04/07/20 at 9:00 am for medication management, and on 04/07/20 at 10:00 am for therapy.  These are in person appointments.  Please arrive at 8:30 am to complete your paperwork.  Contact information: [address] [place] [address] [phone]              Next level of care provider has access to [facility] Link:yes  Safety Planning and Suicide Prevention discussed: Yes,  with father, Jackson Williams     Has patient been referred to the Quitline?: N/A patient is not a smoker  Patient has been referred for addiction treatment: N/A  Otelia Santee, LCSWA 03/09/2020, 10:00 AM

## 2020-03-09 NOTE — Progress Notes (Signed)
Recreation Therapy Notes  Date: 6.14.21 Time: 1000 Location: 500 Hall Dayroom  Group Topic: Communication  Goal Area(s) Addresses:  Patient will identify biggest triggers. Patient will identify strategies to avoid exposure to triggers Patient will identify strategies to deal with triggers head on.  Behavioral Response: Minimal  Intervention: Worksheet, pencils   Activity: Triggers.  Patients were to identify their biggest triggers.  Patients then identified ways to avoid those triggers and identify strategies to deal with triggers head on when unable to be avoided.  Education: Communication, Discharge Planning  Education Outcome: Acknowledges understanding/In group clarification offered/Needs additional education.   Clinical Observations/Feedback: Pt was quiet and was observed filling out his worksheet.  Pt did not share with the group what he wrote.   Caroll Rancher, LRT/CTRS    Caroll Rancher A 03/09/2020 12:17 PM

## 2020-03-09 NOTE — Discharge Summary (Signed)
Physician Discharge Summary Note  Patient:  Jackson Williams is an 24 y.o., male MRN:  [id number] DOB:  [birth year] Patient phone:  [phone] (home)  Patient address:   [address] [place] [address],  Total Time spent with patient: 15 minutes  Date of Admission:  03/03/2020 Date of Discharge: 03/09/20  Reason for Admission: psychosis  Principal Problem: Brief psychotic disorder [facility]) Discharge Diagnoses: Principal Problem:   Brief psychotic disorder ([place]) Active Problems:   Psychosis ([place])   Past Psychiatric History: No prior psychiatric admissions. As per stepfather, patient has no prior history of psychosis. No prior history of severe depressive episodes or of mania/hypomania.   He has not been on psychiatric medications in the past . No history of suicide attempts  Past Medical History: History reviewed. No pertinent past medical history. History reviewed. No pertinent surgical history. Family History: History reviewed. No pertinent family history. Family Psychiatric  History: Denies Social History:  Social History   Substance and Sexual Activity  Alcohol Use No     Social History   Substance and Sexual Activity  Drug Use No    Social History   Socioeconomic History  . Marital status: Single    Spouse name: Not on file  . Number of children: Not on file  . Years of education: Not on file  . Highest education level: Not on file  Occupational History  . Not on file  Tobacco Use  . Smoking status: Never Smoker  . Smokeless tobacco: Never Used  Vaping Use  . Vaping Use: Never used  Substance and Sexual Activity  . Alcohol use: No  . Drug use: No  . Sexual activity: Not on file  Other Topics Concern  . Not on file  Social History Narrative  . Not on file   Social Determinants of Health   Financial Resource Strain:   . Difficulty of Paying Living Expenses:   Food Insecurity:   . Worried About Charity fundraiser in the Last Year:   . Youth worker in the Last Year:   Transportation Needs:   . Film/video editor (Medical):   Marland Kitchen Lack of Transportation (Non-Medical):   Physical Activity:   . Days of Exercise per Week:   . Minutes of Exercise per Session:   Stress:   . Feeling of Stress :   Social Connections:   . Frequency of Communication with Friends and Family:   . Frequency of Social Gatherings with Friends and Family:   . Attends Religious Services:   . Active Member of Clubs or Organizations:   . Attends Archivist Meetings:   Marland Kitchen Marital Status:     Hospital Course:  From admission H&P: 24 year old male.  Information is obtained from his family/stepfather, whom he he did not express consent to speak with ( [name] [phone])  He initially presented to ED on 6/5 with his stepfather.  At the time family reported patient had been treated with steroids for an allergic reaction (had developed an allergic reaction to a bee sting on 5/24 at which time he was initially given parenteral steroids and then a PO steroid course x 5 days) * Of note, stepfather also reports patient appeared to become more agitated and disinhibited after being given Hydroxyzine, which he had also been prescribed Stepfather reports that he " seemed to be doing OK" and at baseline until he recently developed paranoid ideations over the course of 2-3 days last week. He had completed  steroid treatment by then. At the time  patient had expressed feeling scared that people were trying to harm him, get into his car.  He was brought to ED -in ED he presented paranoid and expressed fear that hospital staff was trying to kill him with IV fluids.  He was initially discharged home to parents as was feeling better. He returned  on 6/7, however, due to recurrent  paranoia, selective mutism, disorganized behavior. Family reports patient had not reported or presented with depression or significant neuro-vegetative symptoms. They report he has not made any  suicidal statements or gestures nor has he made threats towards others .  *Currently patient is awake/reports feeling tired but is cooperative with interview.  Provides limited information.  He does endorse auditory hallucinations which she describes as "we will having conversations".  He is unsure how long he has been experiencing these.  He also endorses some depression and states he has been feeling "lonely" and endorses recent decreased sleep, poor appetite and low energy level.  He denies having had any suicidal ideations. He denies any alcohol or drug abuse.  Parents also report they do not suspect any substance abuse. 6/5 and 6/7 UDS were both negative.  6/7 BAL was negative. A head CT scan done on 6/7 was unremarkable without evidence of acute intracranial abnormality  Mr. Basnett was admitted for acute psychosis as described above. He remained on the [facility] unit for six days. He was started on Zoloft and Zyprexa. He participated in group therapy on the unit. He responded well to treatment with no adverse effects reported. He has shown improved mood, affect, sleep, and interaction. He no longer appears paranoid. He denies any SI/HI/AVH and contracts for safety. He shows no signs of responding to internal stimuli. He is discharging on the medications listed below. He agrees to follow up at [facility] (see below). Patient is provided with prescriptions for medications upon discharge. His father is picking him up for discharge home.  Physical Findings: AIMS: Facial and Oral Movements Muscles of Facial Expression: None, normal Lips and Perioral Area: None, normal Jaw: None, normal Tongue: None, normal,Extremity Movements Upper (arms, wrists, hands, fingers): None, normal Lower (legs, knees, ankles, toes): None, normal, Trunk Movements Neck, shoulders, hips: None, normal, Overall Severity Severity of abnormal movements (highest score from questions above): None,  normal Incapacitation due to abnormal movements: None, normal Patient's awareness of abnormal movements (rate only patient's report): No Awareness, Dental Status Current problems with teeth and/or dentures?: No Does patient usually wear dentures?: No  CIWA:    COWS:     Musculoskeletal: Strength & Muscle Tone: within normal limits Gait & Station: normal Patient leans: N/A  Psychiatric Specialty Exam: Physical Exam  Nursing note and vitals reviewed. Constitutional: He is oriented to person, place, and time. He appears well-developed.  Cardiovascular: Normal rate.  Respiratory: Effort normal.  Neurological: He is alert and oriented to person, place, and time.    Review of Systems  Constitutional: Negative.   Respiratory: Negative for cough and shortness of breath.   Psychiatric/Behavioral: Negative for agitation, behavioral problems, confusion, dysphoric mood, hallucinations, self-injury, sleep disturbance and suicidal ideas. The patient is not nervous/anxious and is not hyperactive.     Blood pressure 122/83, pulse 86, temperature 98 F (36.7 C), temperature source Oral, resp. rate 18, height 5\' 7"  (1.702 m), weight 75.3 kg, SpO2 99 %.Body mass index is 26 kg/m.  See MD's discharge SRA      Has this  patient used any form of tobacco in the last 30 days? (Cigarettes, Smokeless Tobacco, Cigars, and/or Pipes)  No  Blood Alcohol level:  Lab Results  Component Value Date   ETH <10 03/02/2020    Metabolic Disorder Labs:  Lab Results  Component Value Date   HGBA1C 5.5 03/05/2020   MPG 111.15 03/05/2020   MPG 111.15 03/04/2020   No results found for: PROLACTIN Lab Results  Component Value Date   CHOL 202 (H) 03/05/2020   TRIG 89 03/05/2020   HDL 57 03/05/2020   CHOLHDL 3.5 03/05/2020   VLDL 18 03/05/2020   LDLCALC 127 (H) 03/05/2020   LDLCALC 131 (H) 03/04/2020    See Psychiatric Specialty Exam and Suicide Risk Assessment completed by Attending Physician prior to  discharge.  Discharge destination:  Home  Is patient on multiple antipsychotic therapies at discharge:  No   Has Patient had three or more failed trials of antipsychotic monotherapy by history:  No  Recommended Plan for Multiple Antipsychotic Therapies: NA  Discharge Instructions    Discharge instructions   Complete by: As directed    Patient is instructed to take all prescribed medications as recommended. Report any side effects or adverse reactions to your outpatient psychiatrist. Patient is instructed to abstain from alcohol and illegal drugs while on prescription medications. In the event of worsening symptoms, patient is instructed to call the crisis hotline, 911, or go to the nearest emergency department for evaluation and treatment.     Allergies as of 03/09/2020      Reactions   Bee Venom    Other Other (See Comments)   Shell fish : Swelling.      Medication List    STOP taking these medications   BENADRYL ALLERGY PO   diphenhydrAMINE-zinc acetate cream Commonly known as: BENADRYL   EPINEPHrine 0.3 mg/0.3 mL Soaj injection Commonly known as: EPI-PEN   hydrOXYzine 25 MG tablet Commonly known as: ATARAX/VISTARIL     TAKE these medications     Indication  OLANZapine 10 MG tablet Commonly known as: ZYPREXA Take 1 tablet (10 mg total) by mouth at bedtime.  Indication: Psychosis   sertraline 50 MG tablet Commonly known as: ZOLOFT Take 1 tablet (50 mg total) by mouth daily. Start taking on: March 10, 2020  Indication: Major Depressive Disorder       Follow-up Information    [facility]. Go on 04/07/2020.   Specialty: Behavioral Health Why: You have an appointment on 04/07/20 at 9:00 am for medication management, and on 04/07/20 at 10:00 am for therapy.  These are in person appointments.  Please arrive at 8:30 am to complete your paperwork.  Contact information: [address] [place] [address] [phone]               Follow-up recommendations: Activity as tolerated. Diet as recommended by primary care physician. Keep all scheduled follow-up appointments as recommended.   Comments:   Patient is instructed to take all prescribed medications as recommended. Report any side effects or adverse reactions to your outpatient psychiatrist. Patient is instructed to abstain from alcohol and illegal drugs while on prescription medications. In the event of worsening symptoms, patient is instructed to call the crisis hotline, 911, or go to the nearest emergency department for evaluation and treatment.  Signed: Aldean Baker, NP 03/09/2020, 3:10 PM

## 2020-03-09 NOTE — BHH Suicide Risk Assessment (Signed)
[facility] Discharge Suicide Risk Assessment   Principal Problem: Brief psychotic disorder [facility]) Discharge Diagnoses: Principal Problem:   Brief psychotic disorder (HCC) Active Problems:   Psychosis (HCC)   Total Time spent with patient: 30 minutes  Musculoskeletal: Strength & Muscle Tone: within normal limits Gait & Station: normal Patient leans: N/A  Psychiatric Specialty Exam: Review of Systems  All other systems reviewed and are negative.   Blood pressure 122/83, pulse 86, temperature 98 F (36.7 C), temperature source Oral, resp. rate 18, height 5\' 7"  (1.702 m), weight 75.3 kg, SpO2 99 %.Body mass index is 26 kg/m.  General Appearance: Casual  Eye Contact::  Fair  Speech:  Normal Rate409  Volume:  Normal  Mood:  Euthymic  Affect:  Congruent  Thought Process:  Coherent and Descriptions of Associations: Intact  Orientation:  Full (Time, Place, and Person)  Thought Content:  Logical  Suicidal Thoughts:  No  Homicidal Thoughts:  No  Memory:  Immediate;   Fair Recent;   Fair Remote;   Fair  Judgement:  Fair  Insight:  Fair  Psychomotor Activity:  Normal  Concentration:  Fair  Recall:  [id number] of Knowledge:Fair  Language: Fair  Akathisia:  Negative  Handed:  Right  AIMS (if indicated):     Assets:  Desire for Improvement Resilience Social Support  Sleep:  Number of Hours: 6.75  Cognition: WNL  ADL's:  Intact   Mental Status Per Nursing Assessment::   On Admission:  NA  Demographic Factors:  Male, Low socioeconomic status and Living alone  Loss Factors: NA  Historical Factors: Impulsivity  Risk Reduction Factors:   Employed and Positive social support  Continued Clinical Symptoms:  Schizophrenia:   Paranoid or undifferentiated type  Cognitive Features That Contribute To Risk:  None    Suicide Risk:  Minimal: No identifiable suicidal ideation.  Patients presenting with no risk factors but with morbid ruminations; may be classified as minimal risk  based on the severity of the depressive symptoms   Follow-up Information    [facility]. Go on 04/07/2020.   Specialty: Behavioral Health Why: You have an appointment on 04/07/20 at 9:00 am for medication management, and on 04/07/20 at 10:00 am for therapy.  These are in person appointments.  Please arrive at 8:30 am to complete your paperwork.  Contact information: [address] [place] [phone]              Plan Of Care/Follow-up recommendations:  Activity:  ad lib  [phone], MD 03/09/2020, 8:11 AM

## 2020-04-07 ENCOUNTER — Encounter (HOSPITAL_COMMUNITY): Payer: Self-pay | Admitting: Psychiatry

## 2020-04-07 ENCOUNTER — Ambulatory Visit (INDEPENDENT_AMBULATORY_CARE_PROVIDER_SITE_OTHER): Payer: No Payment, Other | Admitting: Psychiatry

## 2020-04-07 ENCOUNTER — Ambulatory Visit (INDEPENDENT_AMBULATORY_CARE_PROVIDER_SITE_OTHER): Payer: No Payment, Other | Admitting: Clinical

## 2020-04-07 ENCOUNTER — Other Ambulatory Visit: Payer: Self-pay

## 2020-04-07 DIAGNOSIS — F23 Brief psychotic disorder: Secondary | ICD-10-CM

## 2020-04-07 NOTE — Progress Notes (Signed)
   THERAPIST PROGRESS NOTE   Session Time: 20 minutes  Participation Level: Active  Behavioral Response: CasualAlertEuthymic  Type of Therapy: Individual Therapy  Treatment Goals addressed: Coping  Interventions: Supportive  Summary:  Jackson Williams is a 24 y.o. male who presents in person for the scheduled appointment oriented times five, appropriately dressed, and friendly. Client denied hallucinations and delusions. Client reported he talked to the psychiatrist prior to this appointment on the same date and reviewed his current mental status and discussed his mental health history. Client presented via referral from [facility] following discharge for treatment of psychosis on 03/03/20. Client reported that to be his first hospitalization for a mental health episode. Client reported he is doing better and not hearing voices that make him think about suicide. Client reported he is medication compliant. Client reported he is currently attending [employer] studying information technology but is considering a change in studies to work on [employer]. Client reported he currently works at his parents [employer]. Client reported he enjoys exercising a few days out of the the week. Client reported he is eating and sleeping well.    Suicidal/Homicidal: Nowithout intent/plan  Therapist Response:  Therapist conducted the session in regards to follow up post discharge from the hospital. Therapist engaged the client in discussion about his current symptoms using open ended questions. Therapist worked toward building a therapeutic relationship by using eye contact and active listening. Therapist allowed time to focus on the clients thoughts and feelings. Therapist discussed confidentiality and therapy process. Therapist completed treatment plan consistent with impulse control with the clients input.    Plan: Return again in 3 weeks for individual therapy and continue with medication  management as instructed by the doctor. Client was scheduled for next appointment.  Diagnosis: Brief psychotic disorder    Neena Rhymes Evalena Fujii, LCSW 04/07/2020

## 2020-04-07 NOTE — Progress Notes (Signed)
Psychiatric Initial Adult Assessment   Patient Identification: Jackson Williams MRN:  [id number] Date of Evaluation:  04/07/2020   Referral Source: [facility]  Chief Complaint:   " I am feeling better now."  Visit Diagnosis:    ICD-10-CM   1. Brief psychotic disorder (HCC)  F23     History of Present Illness: This is a 24 year old male with history of brief psychotic disorder seen for psychiatric evaluation and establishing care after recent hospital discharge.  He was hospitalized at [facility] from June 8 to June 14.  Prior to admission he was extremely psychotic with paranoid delusions and auditory hallucinations.  His UDS and BAL were negative . He was also noted to be depressed.  He was discharged on olanzapine 10 mg at bedtime and sertraline 50 mg daily.  Today upon evaluation, patient stated that he is feeling much better.  He informed that he feels a little drowsy when he wakes up in the morning but it is manageable. He reported that his paranoid delusions and auditory hallucinations have improved significantly.  He stated that he is also sleeping better.  He informed that he has been taking his medications regularly and has few more tablets left in his bottles.  He stated that he does not plan to take any more medication after he completes his bottles.  He stated that he does not feel he needs any more medications after.  He denied any suicidal or homicidal ideations.  He denied any anhedonia, feelings of helplessness, poor concentration, poor appetite.  He denied any ongoing psychotic symptoms.  He denied any symptom suggestive of mania or hypomania.  He informed that he is currently a Consulting civil engineer at [employer].  He is taking the summer semester off and has 2 more semesters left after this.   He came to the [place] 9 years ago with his mother and stepfather.  His parents run a [employer] in [place]. He also informed that he has a girlfriend who is an Retail buyer and helps  him with his English pronunciation.  Past Psychiatric History: Brief psychotic disorder, recent hospitalization at [facility] in June 2021.  Previous Psychotropic Medications: Yes   Substance Abuse History in the last 12 months:  No.  Consequences of Substance Abuse: NA  Past Medical History: History reviewed. No pertinent past medical history. History reviewed. No pertinent surgical history.  Family Psychiatric History: denied  Family History: History reviewed. No pertinent family history.  Social History:   Social History   Socioeconomic History  . Marital status: Single    Spouse name: Not on file  . Number of children: Not on file  . Years of education: Not on file  . Highest education level: Not on file  Occupational History  . Not on file  Tobacco Use  . Smoking status: Never Smoker  . Smokeless tobacco: Never Used  Vaping Use  . Vaping Use: Never used  Substance and Sexual Activity  . Alcohol use: No  . Drug use: No  . Sexual activity: Not on file  Other Topics Concern  . Not on file  Social History Narrative  . Not on file   Social Determinants of Health   Financial Resource Strain:   . Difficulty of Paying Living Expenses:   Food Insecurity:   . Worried About Programme researcher, broadcasting/film/video in the Last Year:   . Barista in the Last Year:   Transportation Needs:   . Lack  of Transportation (Medical):   Marland Kitchen Lack of Transportation (Non-Medical):   Physical Activity:   . Days of Exercise per Week:   . Minutes of Exercise per Session:   Stress:   . Feeling of Stress :   Social Connections:   . Frequency of Communication with Friends and Family:   . Frequency of Social Gatherings with Friends and Family:   . Attends Religious Services:   . Active Member of Clubs or Organizations:   . Attends Banker Meetings:   Marland Kitchen Marital Status:     Additional Social History: Lives with parents, student at [employer], studying IT.  Allergies:   Allergies   Allergen Reactions  . Bee Venom   . Other Other (See Comments)    Shell fish : Swelling.    Metabolic Disorder Labs: Lab Results  Component Value Date   HGBA1C 5.5 03/05/2020   MPG 111.15 03/05/2020   MPG 111.15 03/04/2020   No results found for: PROLACTIN Lab Results  Component Value Date   CHOL 202 (H) 03/05/2020   TRIG 89 03/05/2020   HDL 57 03/05/2020   CHOLHDL 3.5 03/05/2020   VLDL 18 03/05/2020   LDLCALC 127 (H) 03/05/2020   LDLCALC 131 (H) 03/04/2020   Lab Results  Component Value Date   TSH 1.520 03/05/2020    Therapeutic Level Labs: No results found for: LITHIUM No results found for: CBMZ No results found for: VALPROATE  Current Medications: Current Outpatient Medications  Medication Sig Dispense Refill  . OLANZapine (ZYPREXA) 10 MG tablet Take 1 tablet (10 mg total) by mouth at bedtime. 30 tablet 0  . sertraline (ZOLOFT) 50 MG tablet Take 1 tablet (50 mg total) by mouth daily. 30 tablet 0   No current facility-administered medications for this visit.    Musculoskeletal: Strength & Muscle Tone: within normal limits Gait & Station: normal Patient leans: N/A  Psychiatric Specialty Exam: Review of Systems  There were no vitals taken for this visit.There is no height or weight on file to calculate BMI.  General Appearance: Well Groomed  Eye Contact:  Good  Speech:  Clear and Coherent and Normal Rate  Volume:  Normal  Mood:  Euthymic  Affect:  Congruent  Thought Process:  Goal Directed and Descriptions of Associations: Intact  Orientation:  Full (Time, Place, and Person)  Thought Content:  Logical and Denied any paranoid delusions, denies any hallucinations  Suicidal Thoughts:  No  Homicidal Thoughts:  No  Memory:  Immediate;   Good Recent;   Good  Judgement:  Fair  Insight:  Fair  Psychomotor Activity:  Normal  Concentration:  Concentration: Good and Attention Span: Good  Recall:  Good  Fund of Knowledge:Good  Language: Good  Akathisia:   Negative  Handed:  Right  AIMS (if indicated):  0  Assets:  Communication Skills Desire for Improvement Financial Resources/Insurance Housing Social Support  ADL's:  Intact  Cognition: WNL  Sleep:  Good   Screenings: AIMS     [facility]  AIMS Total Score 0    AUDIT     Ad[facility]  Alcohol Use Disorder Identification Test Final Score (AUDIT) 0    PHQ2-9     Office Visit from 12/29/2017 in Primary Care at [facility] Visit from 12/22/2017 in Primary Care at [facility] Visit from 12/16/2017 in Primary Care at [facility] Visit from 12/09/2017 in Primary  Care at [facility] Visit from 12/07/2017 in Primary Care at [facility] Total Score 0 0 0 0 0      Assessment and Plan:  Brief psychotic disorder (HCC) 24 year old male with history of brief psychotic disorder recently discharged from [facility] after hospitalization for paranoia and auditory hallucinations.  Patient stated that he is feeling much better on his current regimen of olanzapine 10 mg at bedtime and Zoloft 50 mg daily.  However he does not wish to continue medications after he is completed the current 30-day supply.  He stated that he feels he can manage without medications after that. Patient was encouraged to return back for a follow-up visit in a month from now and was explained that if he does well at that time we may not have to worry about medication treatment however if his symptoms of psychosis relapse then it is recommended that he restarts medications.  Patient verbalizes understanding.  Follow-up in 1 month.   Zena Amos, MD 7/13/202110:27 AM

## 2020-05-06 ENCOUNTER — Ambulatory Visit (INDEPENDENT_AMBULATORY_CARE_PROVIDER_SITE_OTHER): Payer: No Payment, Other | Admitting: Psychiatry

## 2020-05-06 ENCOUNTER — Encounter (HOSPITAL_COMMUNITY): Payer: Self-pay | Admitting: Psychiatry

## 2020-05-06 ENCOUNTER — Other Ambulatory Visit: Payer: Self-pay

## 2020-05-06 DIAGNOSIS — F23 Brief psychotic disorder: Secondary | ICD-10-CM | POA: Diagnosis not present

## 2020-05-06 NOTE — Progress Notes (Signed)
BH MD/PA/NP OP Progress Note  05/06/2020 3:50 PM Jackson Williams  MRN:  [id number]  Chief Complaint: " I am doing much better now."  HPI: This is a 24 year old young male who was recently hospitalized for brief psychotic disorder and was discharged on olanzapine 10 mg at bedtime and Zoloft 50 mg daily.  He was seen by the writer a few weeks ago after his discharge.  At that time he had told the writer that he did not intend to continue the medications after he finishes his current bottle of pills. Today, he presented with his girlfriend.  He stated that he feels like himself now and he is doing much better.  He stated that he has been off his medications for almost 3 weeks now and everything seems to be going well.  He stated that he was feeling quite tired and eating excessively when he was taking his medicines.  He informed that he felt a little strange for maybe 2 to 3 days after he stopped the medicines but after that he was back to being himself.  His girlfriend acknowledged this and stated that he seems to be at his baseline now. He is planning to go back to his classes at [employer] and is taking 4 classes this semester.  He stated that he still helping his parents at their [employer]. He denied any auditory or visual hallucinations.  He denied any ideas of reference.  He denied any paranoid delusions. He denied any suicidal or homicidal ideations.  Patient also has an appointment scheduled to see the therapist for f/up on August 27 and patient asked if he could cancel that appointment as he does not think he needs follow-up.  Based on patient's permission of symptoms and corroboration provided by his girlfriend Clinical research associate was agreeable with patient's plan of canceling appointment.  Visit Diagnosis:    ICD-10-CM   1. Brief psychotic disorder (HCC), in remission  F23     Past Psychiatric History: 1 episode of brief psychotic disorder resulting in hospitalization in June 2021.  Past  Medical History: No past medical history on file. No past surgical history on file.  Family Psychiatric History: Denied  Family History: No family history on file.  Social History:  Social History   Socioeconomic History  . Marital status: Single    Spouse name: Not on file  . Number of children: Not on file  . Years of education: Not on file  . Highest education level: Not on file  Occupational History  . Not on file  Tobacco Use  . Smoking status: Never Smoker  . Smokeless tobacco: Never Used  Vaping Use  . Vaping Use: Never used  Substance and Sexual Activity  . Alcohol use: No  . Drug use: No  . Sexual activity: Not on file  Other Topics Concern  . Not on file  Social History Narrative  . Not on file   Social Determinants of Health   Financial Resource Strain:   . Difficulty of Paying Living Expenses:   Food Insecurity:   . Worried About Programme researcher, broadcasting/film/video in the Last Year:   . Barista in the Last Year:   Transportation Needs:   . Freight forwarder (Medical):   Marland Kitchen Lack of Transportation (Non-Medical):   Physical Activity:   . Days of Exercise per Week:   . Minutes of Exercise per Session:   Stress:   . Feeling of Stress :   Social Connections:   .  Frequency of Communication with Friends and Family:   . Frequency of Social Gatherings with Friends and Family:   . Attends Religious Services:   . Active Member of Clubs or Organizations:   . Attends Banker Meetings:   Marland Kitchen Marital Status:     Allergies:  Allergies  Allergen Reactions  . Bee Venom   . Other Other (See Comments)    Shell fish : Swelling.    Metabolic Disorder Labs: Lab Results  Component Value Date   HGBA1C 5.5 03/05/2020   MPG 111.15 03/05/2020   MPG 111.15 03/04/2020   No results found for: PROLACTIN Lab Results  Component Value Date   CHOL 202 (H) 03/05/2020   TRIG 89 03/05/2020   HDL 57 03/05/2020   CHOLHDL 3.5 03/05/2020   VLDL 18 03/05/2020    LDLCALC 127 (H) 03/05/2020   LDLCALC 131 (H) 03/04/2020   Lab Results  Component Value Date   TSH 1.520 03/05/2020   TSH 1.301 03/02/2020    Therapeutic Level Labs: No results found for: LITHIUM No results found for: VALPROATE No components found for:  CBMZ  Current Medications: Current Outpatient Medications  Medication Sig Dispense Refill  . OLANZapine (ZYPREXA) 10 MG tablet Take 1 tablet (10 mg total) by mouth at bedtime. 30 tablet 0  . sertraline (ZOLOFT) 50 MG tablet Take 1 tablet (50 mg total) by mouth daily. 30 tablet 0   No current facility-administered medications for this visit.     Musculoskeletal: Strength & Muscle Tone: within normal limits Gait & Station: normal Patient leans: N/A  Psychiatric Specialty Exam: Review of Systems  There were no vitals taken for this visit.There is no height or weight on file to calculate BMI.  General Appearance: Fairly Groomed  Eye Contact:  Good  Speech:  Clear and Coherent and Normal Rate  Volume:  Normal  Mood:  Euthymic  Affect:  Appropriate and Congruent  Thought Process:  Goal Directed and Descriptions of Associations: Intact  Orientation:  Full (Time, Place, and Person)  Thought Content: Logical   Suicidal Thoughts:  No  Homicidal Thoughts:  No  Memory:  Immediate;   Good Recent;   Good  Judgement:  Fair  Insight:  Fair  Psychomotor Activity:  Normal  Concentration:  Concentration: Good and Attention Span: Good  Recall:  Good  Fund of Knowledge: Good  Language: Good  Akathisia:  Negative  Handed:  Right  AIMS (if indicated): not done  Assets:  Communication Skills  ADL's:  Intact  Cognition: WNL  Sleep:  Good   Screenings: AIMS     [facility]  AIMS Total Score 0    AUDIT     Ad[facility]  Alcohol Use Disorder Identification Test Final Score (AUDIT) 0     PHQ2-9     Office Visit from 12/29/2017 in Primary Care at [facility] Visit from 12/22/2017 in Primary Care at [facility] Visit from 12/16/2017 in Primary Care at [facility] Visit from 12/09/2017 in Primary Care at [facility] Visit from 12/07/2017 in Primary Care at [facility]  PHQ-2 Total Score 0 0 0 0 0       Assessment and Plan: Based on patient's assessment and collateral information provided by his girlfriend, patient seems to be doing well and has not had relapse of psychotic symptoms after he got off his medications about 2 to 3 weeks  ago.  Patient is not too keen to continue any psychotropic medications at this point or continue individual therapy.  Patient denied any significant stressors that are ongoing and seems to be at a low risk for resurgence of symptoms in the near future.  Patient and his girlfriend were given information about [facility] urgent care center as well as our walk-in hours and were informed to come back in if his psychotic symptoms were to relapse. Patient and his girlfriend verbalized understanding.  Brief psychotic disorder (HCC), in remission  Follow-up as needed.   Zena Amos, MD 05/06/2020, 3:50 PM

## 2020-05-22 ENCOUNTER — Ambulatory Visit (HOSPITAL_COMMUNITY): Payer: No Payment, Other | Admitting: Clinical

## 2021-12-17 IMAGING — CT CT HEAD W/O CM
3 series · 16 of 47 positions shown, 19 images · non-contrast
Comparison: No pertinent prior studies available for comparison.

CLINICAL DATA: Mental status change, unknown cause.

EXAM:
CT HEAD WITHOUT CONTRAST
TECHNIQUE: Contiguous axial images were obtained from the base of the skull
through the vertex without intravenous contrast.

[Series 2: head wo · axial · 0.51mm/px · z∈[-598,-448]mm · 10 of 36 slices shown, 13 images]
[im 3/36  brain]
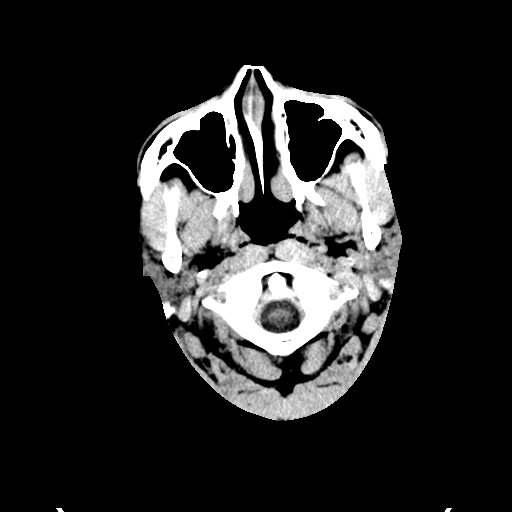
[im 3/36  bone]
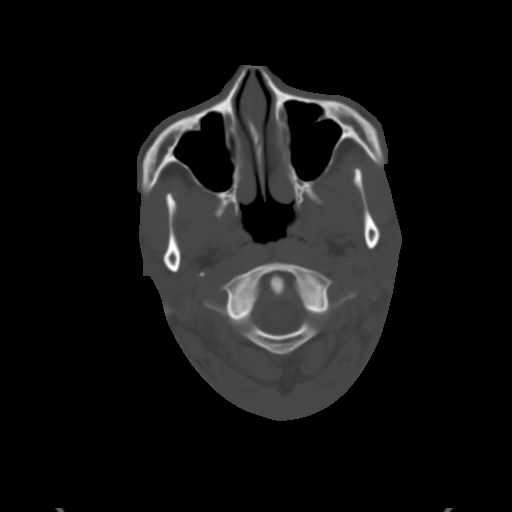
[im 7/36  brain]
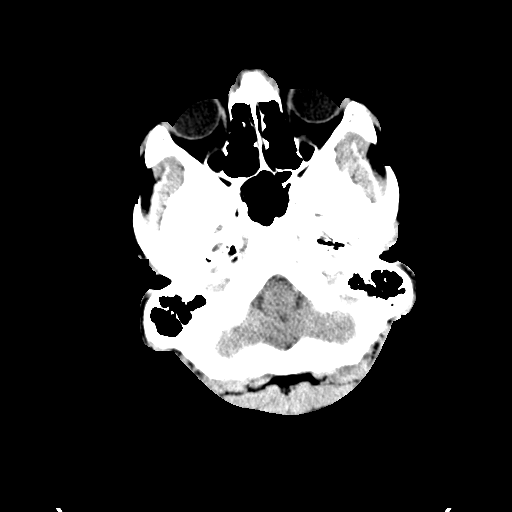
[im 10/36  brain]
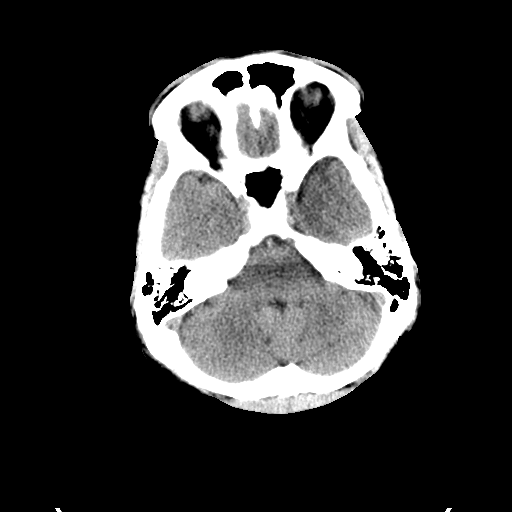
[im 13/36  brain]
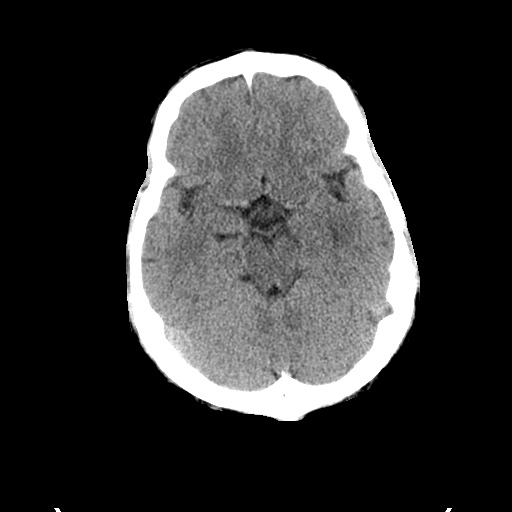
[im 16/36  brain]
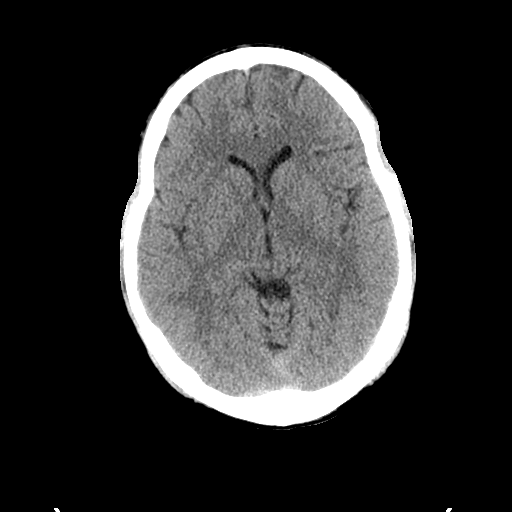
[im 16/36  bone]
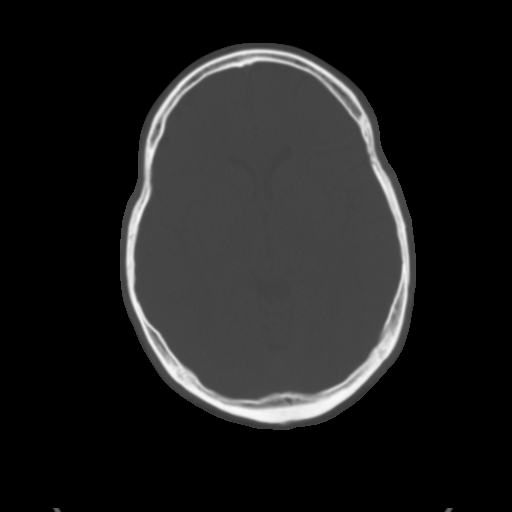
[im 20/36  brain]
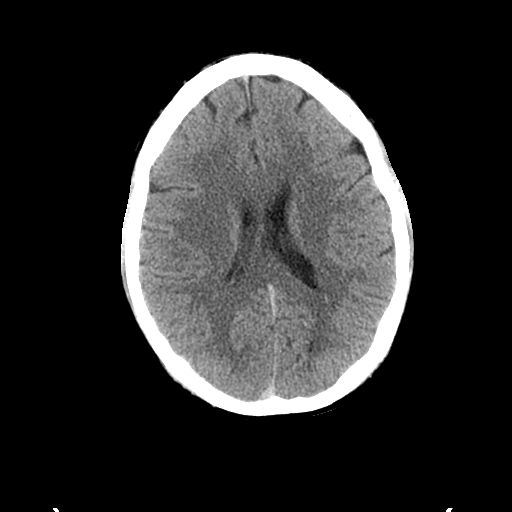
[im 23/36  brain]
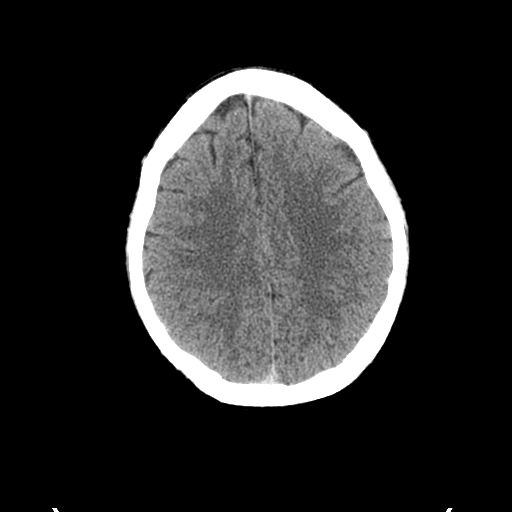
[im 27/36  brain]
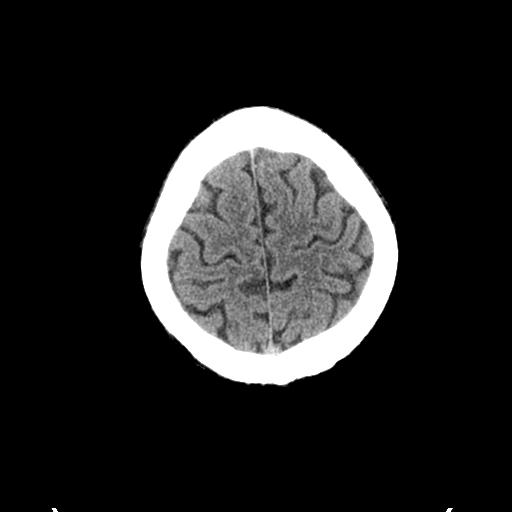
[im 29/36  brain]
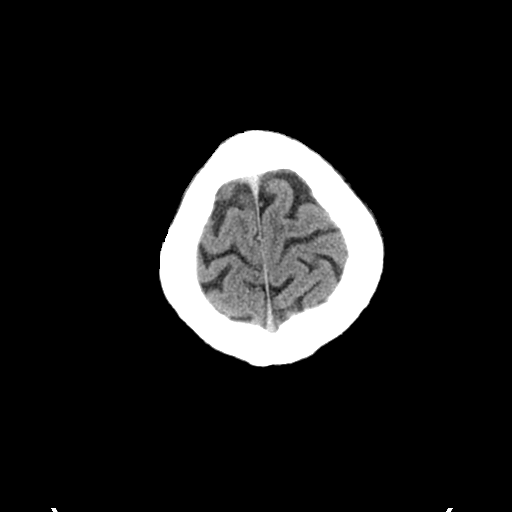
[im 29/36  bone]
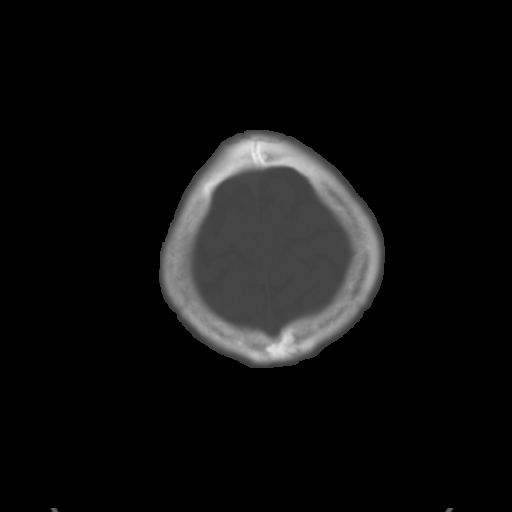
[im 33/36  brain]
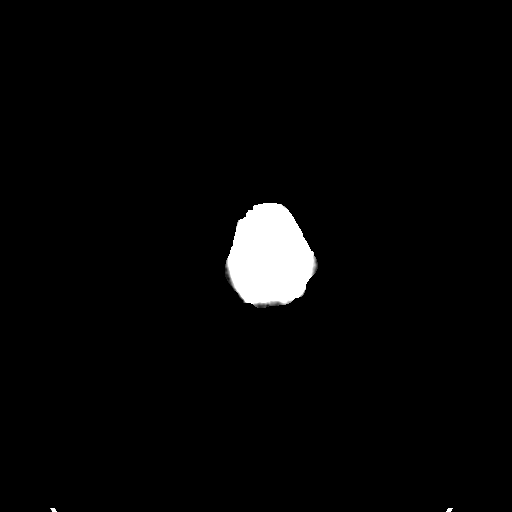

[Series 4: coronal soft tissue · coronal · 0.34mm/px · 3 of 69 slices shown]
[im 23/69  brain]
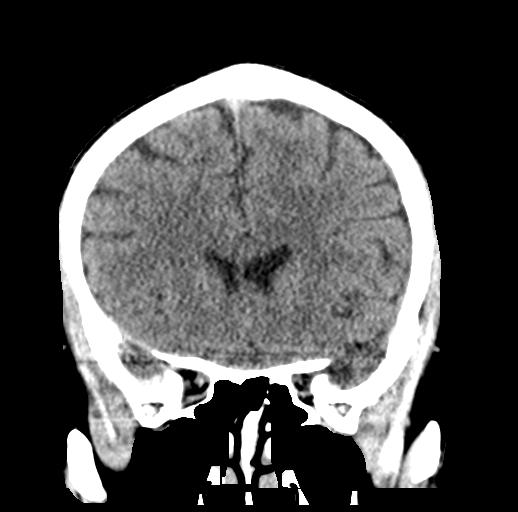
[im 31/69  brain]
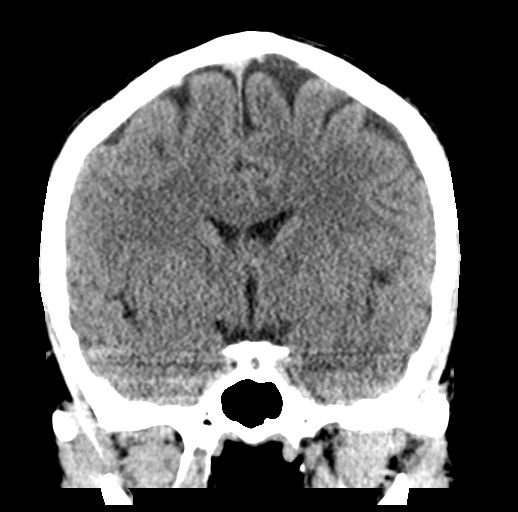
[im 38/69  brain]
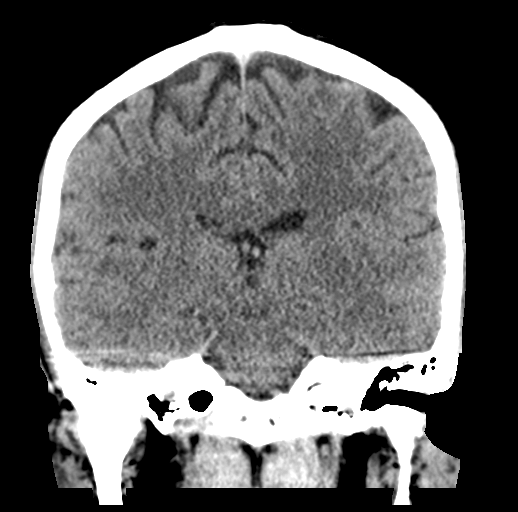

[Series 5: sagittal soft tissue · sagittal · 0.36mm/px · 3 of 58 slices shown]
[im 20/58  brain]
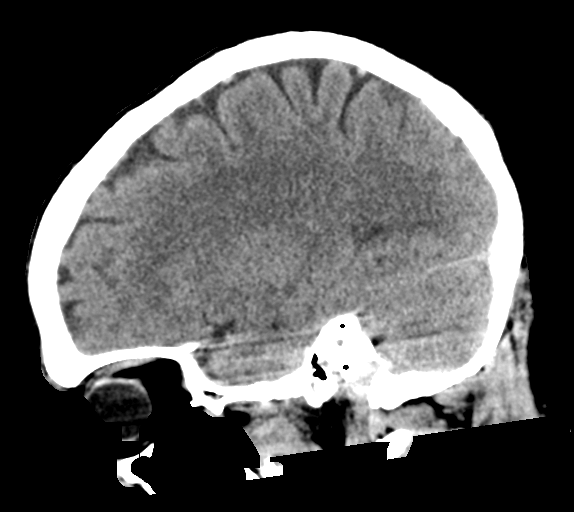
[im 29/58  brain]
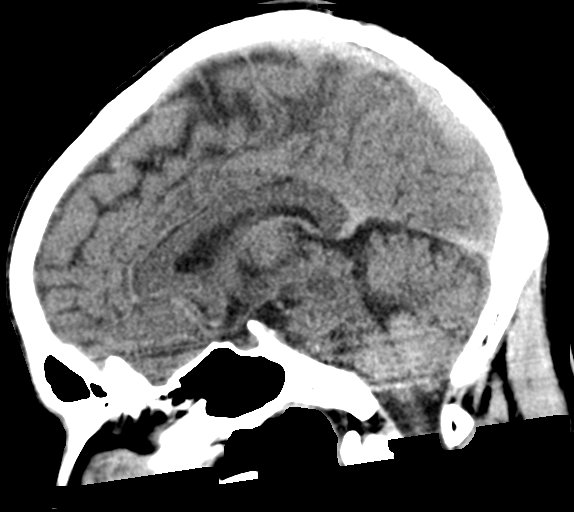
[im 39/58  brain]
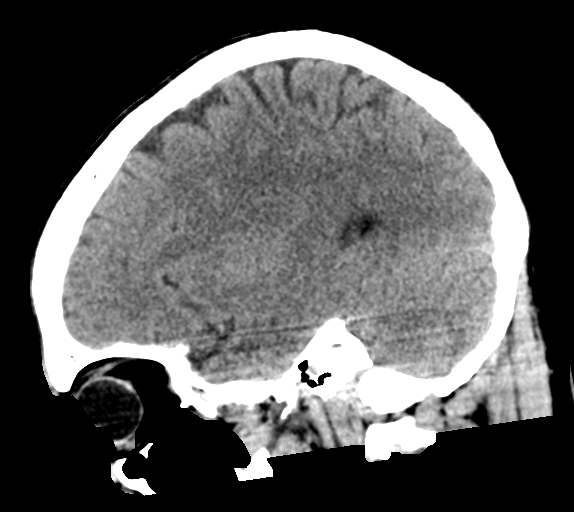

[16 of 47 positions shown; findings below may reference images not displayed]

FINDINGS: Brain:

There is no acute intracranial hemorrhage.

No demarcated cortical infarct.

No extra-axial fluid collection.

No evidence of intracranial mass.

No midline shift.

Vascular: No hyperdense vessel.

Skull: Normal. Negative for fracture or focal lesion.

Sinuses/Orbits: Visualized orbits show no acute finding. No
significant paranasal sinus disease or mastoid effusion at the
imaged levels.
IMPRESSION: Unremarkable CT appearance of the brain. No evidence of acute
intracranial abnormality.

## 2022-05-23 DIAGNOSIS — R112 Nausea with vomiting, unspecified: Secondary | ICD-10-CM | POA: Diagnosis not present

## 2022-05-23 DIAGNOSIS — R12 Heartburn: Secondary | ICD-10-CM | POA: Diagnosis not present

## 2022-05-23 DIAGNOSIS — Z6831 Body mass index (BMI) 31.0-31.9, adult: Secondary | ICD-10-CM | POA: Diagnosis not present

## 2023-10-13 ENCOUNTER — Ambulatory Visit (HOSPITAL_COMMUNITY)
Admission: EM | Admit: 2023-10-13 | Discharge: 2023-10-15 | Disposition: A | Discharge status: Home / Self Care | Payer: MEDICAID | Attending: Psychiatry | Admitting: Psychiatry

## 2023-10-13 DIAGNOSIS — R9431 Abnormal electrocardiogram [ECG] [EKG]: Secondary | ICD-10-CM | POA: Insufficient documentation

## 2023-10-13 DIAGNOSIS — F1210 Cannabis abuse, uncomplicated: Secondary | ICD-10-CM | POA: Insufficient documentation

## 2023-10-13 DIAGNOSIS — F333 Major depressive disorder, recurrent, severe with psychotic symptoms: Secondary | ICD-10-CM | POA: Insufficient documentation

## 2023-10-13 DIAGNOSIS — Z638 Other specified problems related to primary support group: Secondary | ICD-10-CM | POA: Insufficient documentation

## 2023-10-13 DIAGNOSIS — F23 Brief psychotic disorder: Secondary | ICD-10-CM | POA: Insufficient documentation

## 2023-10-13 LAB — CBC WITH DIFFERENTIAL/PLATELET
Abs Immature Granulocytes: 0.05 10*3/uL (ref 0.00–0.07)
Basophils Absolute: 0 10*3/uL (ref 0.0–0.1)
Basophils Relative: 0 %
Eosinophils Absolute: 0.1 10*3/uL (ref 0.0–0.5)
Eosinophils Relative: 1 %
HCT: 53.9 % — ABNORMAL HIGH (ref 39.0–52.0)
Hemoglobin: 18.2 g/dL — ABNORMAL HIGH (ref 13.0–17.0)
Immature Granulocytes: 1 %
Lymphocytes Relative: 28 %
Lymphs Abs: 2.4 10*3/uL (ref 0.7–4.0)
MCH: 28.9 pg (ref 26.0–34.0)
MCHC: 33.8 g/dL (ref 30.0–36.0)
MCV: 85.7 fL (ref 80.0–100.0)
Monocytes Absolute: 0.5 10*3/uL (ref 0.1–1.0)
Monocytes Relative: 5 %
Neutro Abs: 5.5 10*3/uL (ref 1.7–7.7)
Neutrophils Relative %: 65 %
Platelets: 336 10*3/uL (ref 150–400)
RBC: 6.29 MIL/uL — ABNORMAL HIGH (ref 4.22–5.81)
RDW: 13.9 % (ref 11.5–15.5)
WBC: 8.5 10*3/uL (ref 4.0–10.5)
nRBC: 0 % (ref 0.0–0.2)

## 2023-10-13 LAB — COMPREHENSIVE METABOLIC PANEL
ALT: 32 U/L (ref 0–44)
AST: 26 U/L (ref 15–41)
Albumin: 4.6 g/dL (ref 3.5–5.0)
Alkaline Phosphatase: 75 U/L (ref 38–126)
Anion gap: 13 (ref 5–15)
BUN: 7 mg/dL (ref 6–20)
CO2: 26 mmol/L (ref 22–32)
Calcium: 10.4 mg/dL — ABNORMAL HIGH (ref 8.9–10.3)
Chloride: 99 mmol/L (ref 98–111)
Creatinine, Ser: 1.06 mg/dL (ref 0.61–1.24)
GFR, Estimated: >60 mL/min (ref >60)
Glucose, Bld: 94 mg/dL (ref 70–99)
Potassium: 4.3 mmol/L (ref 3.5–5.1)
Sodium: 138 mmol/L (ref 135–145)
Total Bilirubin: 0.8 mg/dL (ref 0.0–1.2)
Total Protein: 8.2 g/dL — ABNORMAL HIGH (ref 6.5–8.1)

## 2023-10-13 LAB — ETHANOL: Alcohol, Ethyl (B): <10 mg/dL (ref <10)

## 2023-10-13 LAB — LIPID PANEL
Cholesterol: 311 mg/dL — ABNORMAL HIGH (ref 0–200)
HDL: 59 mg/dL (ref >40)
LDL Cholesterol: 187 mg/dL — ABNORMAL HIGH (ref 0–99)
Total CHOL/HDL Ratio: 5.3 {ratio}
Triglycerides: 323 mg/dL — ABNORMAL HIGH (ref <150)
VLDL: 65 mg/dL — ABNORMAL HIGH (ref 0–40)

## 2023-10-13 LAB — HEMOGLOBIN A1C
Hgb A1c MFr Bld: 5.6 % (ref 4.8–5.6)
Mean Plasma Glucose: 114.02 mg/dL

## 2023-10-13 LAB — TSH: TSH: 0.917 u[IU]/mL (ref 0.350–4.500)

## 2023-10-13 MED ORDER — ACETAMINOPHEN 325 MG PO TABS: 650.0000 mg | ORAL_TABLET | Freq: Four times a day (QID) | ORAL | Status: DC | PRN

## 2023-10-13 MED ORDER — MAGNESIUM HYDROXIDE 400 MG/5ML PO SUSP: 30.0000 mL | Freq: Every day | ORAL | Status: DC | PRN

## 2023-10-13 MED ORDER — TRAZODONE HCL 50 MG PO TABS
50.0000 mg | ORAL_TABLET | Freq: Every evening | ORAL | Status: DC | PRN
  Administered 2023-10-14: 50 mg via ORAL
  Filled 2023-10-13: qty 1

## 2023-10-13 MED ORDER — OLANZAPINE 5 MG PO TABS
5.0000 mg | ORAL_TABLET | Freq: Every day | ORAL | Status: DC
  Administered 2023-10-13 – 2023-10-14 (×2): 5 mg via ORAL
  Filled 2023-10-13 (×2): qty 1

## 2023-10-13 MED ORDER — ALUM & MAG HYDROXIDE-SIMETH 200-200-20 MG/5ML PO SUSP: 30.0000 mL | ORAL | Status: DC | PRN

## 2023-10-13 NOTE — ED Provider Notes (Incomplete)
[facility] Continuous Assessment Admission H&P  Date: 10/13/23 Patient Name: Jackson Williams MRN: [id number] Chief Complaint: ***  Diagnoses:  Final diagnoses:  None    HPI: History of Present illness: Jackson Williams is a 28 y.o. male with history of brief psychosis requiring inpatient psychiatric admission in 02/2020.  Patient presented voluntarily to [facility] reporting depressive symptoms and suicidal ideation.  Patient was evaluated face-to-face and his chart was reviewed by this nurse practitioner. Language interpreting service (Interpretor # [phone]) was used during assessment.  Patient states that he has been having suicidal ideation on and off for the past 1 to 2 weeks. He reports that his wife recommended that he comes to [facility] for psychiatric evaluation. He initially denied suicidal plan however he reports suicidal plan of hanging himself to TTS counselor, Melynda Ripple. He reports suicidal ideations are due to worsening depressive symptoms. Patient states that he has been experiencing depressive symptoms for a "long time."  He endorses depressive symptoms of sadness, hopelessness, low mood, worthlessness, isolation, anhedonia, and suicidal ideation.  He is unable to identify any triggers for his depression.  He denies history of suicidal attempt however he admits to experiencing suicidal ideation "on and off" for the past 3 years due to his mental health. He denies homicidal ideation and paranoia.  He admits to auditory hallucination of " hearing conversations" however he is unable to explain what to voices were saying.  He denies visual hallucinations.  He admits to smoking marijuana 2-3 times per month.  He says that he drinks 1-2 beers 3 days/week.   On approach, patient is alert and oriented x 4. Patient appears to be having difficulties comprehending assessment question and language interpreting services was recommended of which patient was agreeable. During assessment, patient  appears to be responding to stimuli; he is easily distracted, and had trouble focusing. He is speaking in a mumbled tone and low volume. His eye contact is fair; his mood is depressed and anxious and his affect is congruent with his mood. Thought process is disorganized. No delusional thought content or paranoia noted during assessment.   Total Time spent with patient: 45 minutes  Musculoskeletal  Strength & Muscle Tone: within normal limits Gait & Station: normal Patient leans: Right  Psychiatric Specialty Exam  Presentation General Appearance:  Appropriate for Environment  Eye Contact: Good  Speech: Clear and Coherent  Speech Volume: Normal  Handedness: Right   Mood and Affect  Mood: Depressed  Affect: Congruent   Thought Process  Thought Processes: Disorganized  Descriptions of Associations:Loose  Orientation:Full (Time, Place and Person)  Thought Content:WDL  Diagnosis of Schizophrenia or Schizoaffective disorder in past: No  Duration of Psychotic Symptoms: Greater than six months  Hallucinations:Hallucinations: Auditory Description of Auditory Hallucinations: "I hear conversations"  Ideas of Reference:None  Suicidal Thoughts:Suicidal Thoughts: Yes, Active SI Active Intent and/or Plan: With Plan  Homicidal Thoughts:Homicidal Thoughts: No   Sensorium  Memory: Immediate Poor; Recent Poor; Remote Poor  Judgment: Fair  Insight: Poor   Executive Functions  Concentration: Poor  Attention Span: Poor  Recall: Poor  Fund of Knowledge: Poor  Language: Fair   Psychomotor Activity  Psychomotor Activity: Psychomotor Activity: Normal   Assets  Assets: Desire for Improvement; Social Support; Housing; Vocational/Educational   Sleep  Sleep: Sleep: Good Number of Hours of Sleep: 10   Nutritional Assessment (For OBS and FBC admissions only) Has the patient had a weight loss or gain of 10 pounds or more in the last 3 months?:  No Has the patient had a decrease in food intake/or appetite?: No Does the patient have dental problems?: No Does the patient have eating habits or behaviors that may be indicators of an eating disorder including binging or inducing vomiting?: No Has the patient recently lost weight without trying?: 0 Has the patient been eating poorly because of a decreased appetite?: 0 Malnutrition Screening Tool Score: 0    Physical Exam ROS  Blood pressure (!) 130/99, pulse 87, temperature 97.9 F (36.6 C), temperature source Oral, resp. rate 18, SpO2 98%. There is no height or weight on file to calculate BMI.  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     Is the patient at risk to self? Yes  Has the patient been a risk to self in the past 6 months? Yes .    Has the patient been a risk to self within the distant past? No   Is the patient a risk to others? No   Has the patient been a risk to others in the past 6 months? No   Has the patient been a risk to others within the distant past? No   Past Medical History: psychotic disorder   Family History: none reported by patient  Social History:  Social History   Tobacco Use  . Smoking status: Never  . Smokeless tobacco: Never  Vaping Use  . Vaping status: Never Used  Substance Use Topics  . Alcohol use: No  . Drug use: No       Last Labs:  Admission on 10/13/2023  Component Date Value Ref Range Status  . Sodium 10/13/2023 138  135 - 145 mmol/L Final  . Potassium 10/13/2023 4.3  3.5 - 5.1 mmol/L Final  . Chloride 10/13/2023 99  98 - 111 mmol/L Final  . CO2 10/13/2023 26  22 - 32 mmol/L Final  . Glucose, Bld 10/13/2023 94  70 - 99 mg/dL Final   Glucose reference range applies only to samples taken after fasting for at least 8 hours.  . BUN 10/13/2023 7  6 - 20 mg/dL Final  . Creatinine, Ser 10/13/2023 1.06  0.61 - 1.24 mg/dL Final  . Calcium 28/41/3244 10.4 (H)  8.9 - 10.3 mg/dL  Final  . Total Protein 10/13/2023 8.2 (H)  6.5 - 8.1 g/dL Final  . Albumin 09/28/7251 4.6  3.5 - 5.0 g/dL Final  . AST 66/44/0347 26  15 - 41 U/L Final  . ALT 10/13/2023 32  0 - 44 U/L Final  . Alkaline Phosphatase 10/13/2023 75  38 - 126 U/L Final  . Total Bilirubin 10/13/2023 0.8  0.0 - 1.2 mg/dL Final  . GFR, Estimated 10/13/2023 >60  >60 mL/min Final   Comment: (NOTE) Calculated using the CKD-EPI Creatinine Equation (2021)   . Anion gap 10/13/2023 13  5 - 15 Final   Performed at [facility], [address], [place], [place] [address]  . WBC 10/13/2023 8.5  4.0 - 10.5 K/uL Final  . RBC 10/13/2023 6.29 (H)  4.22 - 5.81 MIL/uL Final  . Hemoglobin 10/13/2023 18.2 (H)  13.0 - 17.0 g/dL Final  . HCT 63/87/5643 53.9 (H)  39.0 - 52.0 % Final  . MCV 10/13/2023 85.7  80.0 - 100.0 fL Final  . MCH 10/13/2023 28.9  26.0 - 34.0 pg Final  . MCHC 10/13/2023 33.8  30.0 - 36.0 g/dL Final  . RDW 32/95/1884 13.9  11.5 - 15.5 % Final  . Platelets  10/13/2023 336  150 - 400 K/uL Final  . nRBC 10/13/2023 0.0  0.0 - 0.2 % Final  . Neutrophils Relative % 10/13/2023 65  % Final  . Neutro Abs 10/13/2023 5.5  1.7 - 7.7 K/uL Final  . Lymphocytes Relative 10/13/2023 28  % Final  . Lymphs Abs 10/13/2023 2.4  0.7 - 4.0 K/uL Final  . Monocytes Relative 10/13/2023 5  % Final  . Monocytes Absolute 10/13/2023 0.5  0.1 - 1.0 K/uL Final  . Eosinophils Relative 10/13/2023 1  % Final  . Eosinophils Absolute 10/13/2023 0.1  0.0 - 0.5 K/uL Final  . Basophils Relative 10/13/2023 0  % Final  . Basophils Absolute 10/13/2023 0.0  0.0 - 0.1 K/uL Final  . Immature Granulocytes 10/13/2023 1  % Final  . Abs Immature Granulocytes 10/13/2023 0.05  0.00 - 0.07 K/uL Final   Performed at [facility], [address] [place], [place] [address]  . Hgb A1c MFr Bld 10/13/2023 5.6  4.8 - 5.6 % Final   Comment: (NOTE) Pre diabetes:          5.7%-6.4%  Diabetes:              >6.4%  Glycemic control for   <7.0% adults  with diabetes   . Mean Plasma Glucose 10/13/2023 114.02  mg/dL Final   Performed at [facility], [address] [place], [place] [address]  . Alcohol, Ethyl (B) 10/13/2023 <10  <10 mg/dL Final   Comment: (NOTE) Lowest detectable limit for serum alcohol is 10 mg/dL.  For medical purposes only. Performed at [facility], [address] [place], [place] [address]   . Cholesterol 10/13/2023 311 (H)  0 - 200 mg/dL Final  . Triglycerides 10/13/2023 323 (H)  <150 mg/dL Final  . HDL 47/42/5956 59  >40 mg/dL Final  . Total CHOL/HDL Ratio 10/13/2023 5.3  RATIO Final  . VLDL 10/13/2023 65 (H)  0 - 40 mg/dL Final  . LDL Cholesterol 10/13/2023 187 (H)  0 - 99 mg/dL Final   Comment:        Total Cholesterol/HDL:CHD Risk Coronary Heart Disease Risk Table                     Men   Women  1/2 Average Risk   3.4   3.3  Average Risk       5.0   4.4  2 X Average Risk   9.6   7.1  3 X Average Risk  23.4   11.0        Use the calculated Patient Ratio above and the CHD Risk Table to determine the patient's CHD Risk.        ATP III CLASSIFICATION (LDL):  <100     mg/dL   Optimal  387-564  mg/dL   Near or Above                    Optimal  130-159  mg/dL   Borderline  332-951  mg/dL   High  >884     mg/dL   Very High Performed at [facility], [address] [place], [place] [address]   . TSH 10/13/2023 0.917  0.350 - 4.500 uIU/mL Final   Comment: Performed by a 3rd Generation assay with a functional sensitivity of <=0.01 uIU/mL. Performed at [facility], [address], [place], [place] [address]     Allergies: Bee venom  and Other  Medications:  Facility Ordered Medications  Medication  . acetaminophen (TYLENOL) tablet 650 mg  . alum & mag hydroxide-simeth (MAALOX/MYLANTA) 200-200-20 MG/5ML suspension 30 mL  . magnesium hydroxide (MILK OF MAGNESIA) suspension 30 mL  . traZODone (DESYREL) tablet 50 mg  . [START ON 10/14/2023] OLANZapine (ZYPREXA) tablet 5 mg       Medical Decision Making  ***    Recommendations  {[facility] MSE Recommendations:304701}  Maricela Bo, NP 10/13/23  11:36 PM

## 2023-10-13 NOTE — ED Provider Notes (Incomplete)
[facility]al [facility] Urgent Care Medical Screening Exam  Patient Name: Coryon Bruegger MRN: [id number] Date of Evaluation: 10/13/23 Chief Complaint:   Diagnosis:  Final diagnoses:  None     Flowsheet Row ED from 10/13/2023 in [facility] (Discharged[facility]  C-SSRS RISK CATEGORY Low Risk No Risk       Psychiatric Specialty Exam  Presentation  General Appearance:Appropriate for Environment  Eye Contact:Good  Speech:Clear and Coherent  Speech Volume:Normal  Handedness:Right   Mood and Affect  Mood: Depressed  Affect: Congruent   Thought Process  Thought Processes: Disorganized  Descriptions of Associations:Loose  Orientation:Full (Time, Place and Person)  Thought Content:WDL    Hallucinations:Auditory "I hear conversations"  Ideas of Reference:None  Suicidal Thoughts:Yes, Active With Plan  Homicidal Thoughts:No   Sensorium  Memory: Immediate Poor; Recent Poor; Remote Poor  Judgment: Fair  Insight: Poor   Executive Functions  Concentration: Poor  Attention Span: Poor  Recall: Poor  Fund of Knowledge: Poor  Language: Fair   Psychomotor Activity  Psychomotor Activity: Normal   Assets  Assets: Desire for Improvement; Social Support; Housing; Vocational/Educational   Sleep  Sleep: Good  Number of hours:  10   Physical Exam: Physical Exam ROS Blood pressure (!) 130/99, pulse 87, temperature 97.9 F (36.6 C), temperature source Oral, resp. rate 18, SpO2 98%. There is no height or weight on file to calculate BMI.  Musculoskeletal: Strength & Muscle Tone: within normal limits Gait & Station: normal Patient leans: Right   [facility] MSE Discharge Disposition for Follow up and Recommendations: Based on my evaluation I certify that psychiatric inpatient services furnished can reasonably be expected to improve the patient's  condition which I recommend transfer to an appropriate accepting facility.    Maricela Bo, NP 10/13/2023, 9:57 PM

## 2023-10-13 NOTE — ED Provider Notes (Addendum)
[facility]gent [facility] Continuous Assessment Admission H&P  Date: 10/14/23 Patient Name: Jackson Williams MRN: [id number] Chief Complaint: depression and suicidal thoughts   Diagnoses:  Final diagnoses:  Severe episode of recurrent major depressive disorder, with psychotic features (HCC)    HPI: History of Present illness: Jackson Williams is a 28 y.o. male with history of brief psychosis requiring inpatient psychiatric admission in 02/2020.  Patient presented voluntarily to [facility] reporting depressive symptoms and suicidal ideation.  Patient was evaluated face-to-face and his chart was reviewed by this nurse practitioner. Language interpreting service (Interpretor # [phone]) was used during assessment.  Patient states that he has been having suicidal ideation on and off for the past 1 to 2 weeks. He reports that his wife recommended that he comes to [facility] for psychiatric evaluation. He initially denied suicidal plan however he reports suicidal plan of hanging himself to TTS counselor, Melynda Ripple. He reports suicidal ideations are due to worsening depressive symptoms. Patient states that he has been experiencing depressive symptoms for a "long time."  He endorses depressive symptoms of sadness, hopelessness, low mood, worthlessness, isolation, anhedonia, and suicidal ideation.  He is unable to identify any triggers for his depression.  He denies history of suicidal attempt however he admits to experiencing suicidal ideation "on and off" for the past 3 years due to his mental health. He denies homicidal ideation and paranoia.  He admits to auditory hallucination of " hearing conversations" however he is unable to explain what to voices were saying.  He denies visual hallucinations.  He admits to smoking marijuana 2-3 times per month.  He says that he drinks 1-2 beers 3 days/week.   On approach, patient is alert and oriented x 4. Patient appears to be having difficulties comprehending assessment question  and language interpreting services was recommended of which patient was agreeable. During assessment, patient is easily distracted, had trouble focusing and appeared to be responding to stimuli. He is speaking in a mumbled tone and low volume. His eye contact is fair; his mood is depressed and anxious and his affect is congruent with his mood. Thought process is disorganized. No delusional thought content or paranoia noted during assessment.   Total Time spent with patient: 45 minutes  Musculoskeletal  Strength & Muscle Tone: within normal limits Gait & Station: normal Patient leans: Right  Psychiatric Specialty Exam  Presentation General Appearance:  Appropriate for Environment  Eye Contact: Good  Speech: Clear and Coherent  Speech Volume: Normal  Handedness: Right   Mood and Affect  Mood: Depressed  Affect: Congruent   Thought Process  Thought Processes: Disorganized  Descriptions of Associations:Loose  Orientation:Full (Time, Place and Person)  Thought Content:WDL  Diagnosis of Schizophrenia or Schizoaffective disorder in past: No  Duration of Psychotic Symptoms: Greater than six months  Hallucinations:Hallucinations: Auditory Description of Auditory Hallucinations: "I hear conversations"  Ideas of Reference:None  Suicidal Thoughts:Suicidal Thoughts: Yes, Active SI Active Intent and/or Plan: With Plan  Homicidal Thoughts:Homicidal Thoughts: No   Sensorium  Memory: Immediate Poor; Recent Poor; Remote Poor  Judgment: Fair  Insight: Poor   Executive Functions  Concentration: Poor  Attention Span: Poor  Recall: Poor  Fund of Knowledge: Poor  Language: Fair   Psychomotor Activity  Psychomotor Activity: Psychomotor Activity: Normal   Assets  Assets: Desire for Improvement; Social Support; Housing; Vocational/Educational   Sleep  Sleep: Sleep: Good Number of Hours of Sleep: 10   Nutritional Assessment (For OBS and FBC  admissions only) Has the patient had a  weight loss or gain of 10 pounds or more in the last 3 months?: No Has the patient had a decrease in food intake/or appetite?: No Does the patient have dental problems?: No Does the patient have eating habits or behaviors that may be indicators of an eating disorder including binging or inducing vomiting?: No Has the patient recently lost weight without trying?: 0 Has the patient been eating poorly because of a decreased appetite?: 0 Malnutrition Screening Tool Score: 0    Physical Exam Vitals and nursing note reviewed.  Constitutional:      General: He is not in acute distress.    Appearance: He is well-developed. He is not ill-appearing.  HENT:     Head: Normocephalic and atraumatic.  Eyes:     Conjunctiva/sclera: Conjunctivae normal.  Cardiovascular:     Rate and Rhythm: Normal rate.  Pulmonary:     Effort: Pulmonary effort is normal.  Abdominal:     Tenderness: There is no abdominal tenderness.  Musculoskeletal:        General: Normal range of motion.     Cervical back: Normal range of motion.  Neurological:     Mental Status: He is alert and oriented to person, place, and time.  Psychiatric:        Attention and Perception: Attention normal. He perceives auditory hallucinations.        Mood and Affect: Mood normal.        Speech: Speech normal.        Behavior: Behavior normal. Behavior is cooperative.        Thought Content: Thought content includes suicidal ideation. Thought content includes suicidal plan.        Cognition and Memory: Cognition is impaired.    Review of Systems  Constitutional: Negative.   HENT: Negative.    Eyes: Negative.   Respiratory: Negative.    Cardiovascular: Negative.   Gastrointestinal: Negative.   Genitourinary: Negative.   Musculoskeletal: Negative.   Skin: Negative.   Neurological: Negative.   Endo/Heme/Allergies: Negative.   Psychiatric/Behavioral:  Positive for depression, hallucinations,  substance abuse and suicidal ideas. The patient is nervous/anxious.     Blood pressure (!) 130/99, pulse 87, temperature 97.9 F (36.6 C), temperature source Oral, resp. rate 18, SpO2 98%. There is no height or weight on file to calculate BMI.  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     Is the patient at risk to self? Yes  Has the patient been a risk to self in the past 6 months? Yes .    Has the patient been a risk to self within the distant past? No   Is the patient a risk to others? No   Has the patient been a risk to others in the past 6 months? No   Has the patient been a risk to others within the distant past? No   Past Medical History: psychotic disorder   Family History: none reported by patient  Social History:  Social History   Tobacco Use   Smoking status: Never   Smokeless tobacco: Never  Vaping Use   Vaping status: Never Used  Substance Use Topics   Alcohol use: No   Drug use: No       Last Labs:  Admission on 10/13/2023  Component Date Value Ref Range Status   Sodium 10/13/2023 138  135 - 145 mmol/L Final   Potassium 10/13/2023 4.3  3.5 - 5.1 mmol/L Final   Chloride 10/13/2023  99  98 - 111 mmol/L Final   CO2 10/13/2023 26  22 - 32 mmol/L Final   Glucose, Bld 10/13/2023 94  70 - 99 mg/dL Final   Glucose reference range applies only to samples taken after fasting for at least 8 hours.   BUN 10/13/2023 7  6 - 20 mg/dL Final   Creatinine, Ser 10/13/2023 1.06  0.61 - 1.24 mg/dL Final   Calcium 16/06/9603 10.4 (H)  8.9 - 10.3 mg/dL Final   Total Protein 54/05/8118 8.2 (H)  6.5 - 8.1 g/dL Final   Albumin 14/78/2956 4.6  3.5 - 5.0 g/dL Final   AST 21/30/8657 26  15 - 41 U/L Final   ALT 10/13/2023 32  0 - 44 U/L Final   Alkaline Phosphatase 10/13/2023 75  38 - 126 U/L Final   Total Bilirubin 10/13/2023 0.8  0.0 - 1.2 mg/dL Final   GFR, Estimated 10/13/2023 >60  >60 mL/min Final   Comment:  (NOTE) Calculated using the CKD-EPI Creatinine Equation (2021)    Anion gap 10/13/2023 13  5 - 15 Final   Performed at [facility], [address] [place], [place] [address]   WBC 10/13/2023 8.5  4.0 - 10.5 K/uL Final   RBC 10/13/2023 6.29 (H)  4.22 - 5.81 MIL/uL Final   Hemoglobin 10/13/2023 18.2 (H)  13.0 - 17.0 g/dL Final   HCT 29/52/8413 53.9 (H)  39.0 - 52.0 % Final   MCV 10/13/2023 85.7  80.0 - 100.0 fL Final   MCH 10/13/2023 28.9  26.0 - 34.0 pg Final   MCHC 10/13/2023 33.8  30.0 - 36.0 g/dL Final   RDW 24/40/1027 13.9  11.5 - 15.5 % Final   Platelets 10/13/2023 336  150 - 400 K/uL Final   nRBC 10/13/2023 0.0  0.0 - 0.2 % Final   Neutrophils Relative % 10/13/2023 65  % Final   Neutro Abs 10/13/2023 5.5  1.7 - 7.7 K/uL Final   Lymphocytes Relative 10/13/2023 28  % Final   Lymphs Abs 10/13/2023 2.4  0.7 - 4.0 K/uL Final   Monocytes Relative 10/13/2023 5  % Final   Monocytes Absolute 10/13/2023 0.5  0.1 - 1.0 K/uL Final   Eosinophils Relative 10/13/2023 1  % Final   Eosinophils Absolute 10/13/2023 0.1  0.0 - 0.5 K/uL Final   Basophils Relative 10/13/2023 0  % Final   Basophils Absolute 10/13/2023 0.0  0.0 - 0.1 K/uL Final   Immature Granulocytes 10/13/2023 1  % Final   Abs Immature Granulocytes 10/13/2023 0.05  0.00 - 0.07 K/uL Final   Performed at [facility], [address], [place], [place] [address]   Hgb A1c MFr Bld 10/13/2023 5.6  4.8 - 5.6 % Final   Comment: (NOTE) Pre diabetes:          5.7%-6.4%  Diabetes:              >6.4%  Glycemic control for   <7.0% adults with diabetes    Mean Plasma Glucose 10/13/2023 114.02  mg/dL Final   Performed at [facility], [address], [place], [place] [address]   Alcohol, Ethyl (B) 10/13/2023 <10  <10 mg/dL Final   Comment: (NOTE) Lowest detectable limit for serum alcohol is 10 mg/dL.  For medical purposes only. Performed at [facility], [address], [place], [place] [address]     Cholesterol 10/13/2023 311 (H)  0 - 200 mg/dL Final   Triglycerides 56/38/7564 323 (  H)  <150 mg/dL Final   HDL 33/29/5188 59  >40 mg/dL Final   Total CHOL/HDL Ratio 10/13/2023 5.3  RATIO Final   VLDL 10/13/2023 65 (H)  0 - 40 mg/dL Final   LDL Cholesterol 10/13/2023 187 (H)  0 - 99 mg/dL Final   Comment:        Total Cholesterol/HDL:CHD Risk Coronary Heart Disease Risk Table                     Men   Women  1/2 Average Risk   3.4   3.3  Average Risk       5.0   4.4  2 X Average Risk   9.6   7.1  3 X Average Risk  23.4   11.0        Use the calculated Patient Ratio above and the CHD Risk Table to determine the patient's CHD Risk.        ATP III CLASSIFICATION (LDL):  <100     mg/dL   Optimal  416-606  mg/dL   Near or Above                    Optimal  130-159  mg/dL   Borderline  301-601  mg/dL   High  >093     mg/dL   Very High Performed at [facility], [address], [place], [place] [address]    TSH 10/13/2023 0.917  0.350 - 4.500 uIU/mL Final   Comment: Performed by a 3rd Generation assay with a functional sensitivity of <=0.01 uIU/mL. Performed at [facility], [address], [place], [place] [address]     Allergies: Bee venom, Hydroxyzine hcl, and Other  Medications:  Facility Ordered Medications  Medication   acetaminophen (TYLENOL) tablet 650 mg   alum & mag hydroxide-simeth (MAALOX/MYLANTA) 200-200-20 MG/5ML suspension 30 mL   magnesium hydroxide (MILK OF MAGNESIA) suspension 30 mL   traZODone (DESYREL) tablet 50 mg   OLANZapine (ZYPREXA) tablet 5 mg      Medical Decision Making  Patient is recommended for inpatient psychiatric admission for stabilization. -Labs: CBC, CMP, TSH, A1C, UDS, Lipid,  -EKG -start Zyprexa 5mg /night     Recommendations  Based on my evaluation the patient does not appear to have an emergency medical condition.  Maricela Bo, NP 10/14/23  4:04 AM
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
## 2023-10-13 NOTE — Progress Notes (Addendum)
   10/13/23 1723  [facility] Triage Screening (Walk-ins at [facility] only)  How Did You Hear About [place]? Legal System  What Is the Reason for Your Visit/Call Today? Jackson Williams presents to [facility] voluntarily via [facility]. Pt states that he had suicidal thoughts about a week or 2 ago without a plan. Pt states that he was told he needed to come here and get an evaluation. Pt currently denies SI, HI, AVH and alcohol/drug use. Pt is observed responding to internal stimuli and looking around the assessment room.  How Long Has This Been Causing You Problems? 1 wk - 1 month  Have You Recently Had Any Thoughts About Hurting Yourself? Yes  How long ago did you have thoughts about hurting yourself? a week or 2 ago - no plan  Are You Planning to Commit Suicide/Harm Yourself At This time? No  Have you Recently Had Thoughts About Hurting Someone [name]? No  Are You Planning To Harm Someone At This Time? No  Physical Abuse Denies  Verbal Abuse Denies  Sexual Abuse Denies  Exploitation of patient/patient's resources Denies  Self-Neglect Denies  Are you currently experiencing any auditory, visual or other hallucinations? No  Have You Used Any Alcohol or Drugs in the Past 24 Hours? No  Do you have any current medical co-morbidities that require immediate attention? No  Clinician description of patient physical appearance/behavior: casually dressed, calm, cooperative  What Do You Feel Would Help You the Most Today? Social Support  If access to [facility] was not available, would you have sought care in the Emergency Department? No  Determination of Need Routine (7 days)  Options For Referral Medication Management;Inpatient Hospitalization;Outpatient Therapy

## 2023-10-13 NOTE — ED Notes (Signed)
Patient present [facility] states that he had suicidal thoughts about a week or 2 ago without a plan..Currently denies SI/HI and AVH. Patient oriented to unit. Meal and beverage offered and patient decline. Skin warm and dry. No acute distress noted.

## 2023-10-13 NOTE — ED Notes (Signed)
Pt sleeping@this time breathing even and unlabored will continue to monitor for safety 

## 2023-10-14 LAB — POCT URINE DRUG SCREEN - MANUAL ENTRY (I-SCREEN)
POC Amphetamine UR: NOT DETECTED
POC Buprenorphine (BUP): NOT DETECTED
POC Cocaine UR: NOT DETECTED
POC Marijuana UR: NOT DETECTED
POC Methadone UR: NOT DETECTED
POC Methamphetamine UR: NOT DETECTED
POC Morphine: NOT DETECTED
POC Oxazepam (BZO): NOT DETECTED
POC Oxycodone UR: NOT DETECTED
POC Secobarbital (BAR): NOT DETECTED

## 2023-10-14 NOTE — Progress Notes (Signed)
Pt was accepted to [facility]) [place] [address] TODAY 10/14/2023; Bed Assignment Facility [facility]) PENDING Discharges and UDS results  Address: [address] [place], [place]   Pt meets criteria per Santa Lighter, NP   Attending Physician will be: Will be provided during full acceptance after conformation of PENDING items.  Report can be called to: - [phone]  Pt can arrive after: CONE [facility] AC to coordinate with care team.  Care Team notified: Night CONE [address] [facility] [place], Day CONE [facility] AC Delena Bali, Melynda Ripple, Counselor, [place], LCSW   Er[place], [place] 10/14/2023 @ 12:51 AM

## 2023-10-14 NOTE — ED Notes (Signed)
Pt sleeping in no acute distress. RR even and unlabored. Environment secured. Will continue to monitor for safety. 
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
## 2023-10-14 NOTE — ED Notes (Addendum)
Pt laying in bed but easily awakened when name called. Pt states he is unable to speak English but answer all basic questions in English (smiling). Pt denies SI/HI/AVH this morning. Denies needs. Environment secured. Will continue to monitor for safety.

## 2023-10-14 NOTE — ED Notes (Signed)
Pt sleeping@this time breathing even and unlabored will continue to monitor for safety 

## 2023-10-14 NOTE — ED Notes (Signed)
 Pt sitting in dayroom eating dinner. No acute distress noted. No concerns voiced. Informed pt to notify staff with any needs or assistance. Pt verbalized understanding or agreement. Will continue to monitor for safety.

## 2023-10-15 MED ORDER — OLANZAPINE 5 MG PO TABS: 5.0000 mg | ORAL_TABLET | Freq: Every day | ORAL | 0 refills | Status: AC

## 2023-10-15 NOTE — ED Notes (Signed)
 Patient resting in lounger with eyes closed, respirations even and unlabored. Patient in no apparent acute distress. Environment secured. Safety checks in place per facility protocol.

## 2023-10-15 NOTE — ED Provider Notes (Signed)
FBC/OBS ASAP Discharge Summary  Date and Time: 10/15/2023 12:22 PM  Name: Jackson Williams  MRN:  [id number]   Discharge Diagnoses:  Final diagnoses:  Severe episode of recurrent major depressive disorder, with psychotic features (HCC)   HPI: History of Present illness: Jackson Williams is a 28 y.o. male with history of brief psychosis requiring inpatient psychiatric admission in 02/2020.  [facility] reporting depressive symptoms and suicidal ideation.    Patient was initially recommended for inpatient psychiatric admission and was admitted to the continuous assessment unit.     Patient seen face-to-face by this provider, chart reviewed, and case consulted with Dr. Toula Moos on 10/15/2023.  Patient was offered interpretation services but refused.  He was able to answer all questions appropriately throughout assessment.  Subjective:   Currently on assessment patient is observed laying in his bed awake.  He is alert/oriented x 4, cooperative, and attentive.  He reports being unable to sleep on the unit due to noise.  Reports he feels that his depression has improved.  He has a euthymic affect.  He does continue to experience anxiety at times.  States the noise and doors on the unit tend to contribute to the anxiety.  He is currently denying any suicidal ideations.  He verbally contracts for safety.  He denies access to firearms/weapons.  He denies homicidal ideations.  He denies AVH.  He does not appear manic, psychotic, or paranoid.  He does not appear to be responding to internal/external stimuli.  Call contact Jackson Williams [phone].  Jackson Williams reports she talk to patient last night and believes that he is at his baseline.  She has no immediate safety concerns with patient returning home.  She confirms that patient has no access to firearms/weapons.  In addition she has agreed to remove any object that would be of concern such as OTC medications,  prescription medications, denies/sharps out of patient's access.  At this time Jackson Williams and his spouse were educated and verbalizes understanding of mental health resources and other crisis services in the community.  He/they were instructed to call 911 and present to the nearest emergency room should he experience any suicidal/homicidal ideation, auditory/visual/hallucinations, or detrimental worsening of his his mental health condition.   Stay Summary:   Jackson Williams was admitted to observation for depression, suicidal ideations and crisis management.  He was treated with Zyprexa 5 mg nightly which was tolerated with no adverse reactions.  Jackson Williams was discharged with current medication and was instructed on how to take medications as prescribed.  Educated patient on medication including any adverse reactions.  Improvement was monitored by observation and Jackson Williams report of symptom reduction.  He is emotional and mental status was also monitored by staff.  At no time while on the unit did patient exhibit any suicidal ideations or any self-harm behaviors.  He received no as needed medications for agitation.  He remained compliant with medications and appropriate with staff.        Patient was evaluated for stability and plans for continued recovery upon discharge.He  agrees to follow-up with [facility] or [facility] outpatient services on the second floor.  Other additional outpatient resources for medication management and therapy was provided.  Upon completion of this admission the Jackson Williams was both mentally and medically stable for discharge denying suicidal/homicidal ideation, auditory/visual/tactile hallucinations, delusional thoughts and paranoia.      Total Time spent with patient: 20 minutes  Past Psychiatric History:  see H&P Past Medical History: see H&P Family History: see H&P Family Psychiatric History: see H&P Social History:  see H&P Tobacco Cessation:  N/A, patient does not currently use tobacco products  Current Medications:  Current Facility-Administered Medications  Medication Dose Route Frequency Provider Last Rate Last Admin   acetaminophen (TYLENOL) tablet 650 mg  650 mg Oral Q6H PRN Ajibola, Ene A, NP       alum & mag hydroxide-simeth (MAALOX/MYLANTA) 200-200-20 MG/5ML suspension 30 mL  30 mL Oral Q4H PRN Ajibola, Ene A, NP       magnesium hydroxide (MILK OF MAGNESIA) suspension 30 mL  30 mL Oral Daily PRN Ajibola, Ene A, NP       OLANZapine (ZYPREXA) tablet 5 mg  5 mg Oral QHS Ajibola, Ene A, NP   5 mg at 10/14/23 2131   traZODone (DESYREL) tablet 50 mg  50 mg Oral QHS PRN Ajibola, Ene A, NP   50 mg at 10/14/23 2131   Current Outpatient Medications  Medication Sig Dispense Refill   OLANZapine (ZYPREXA) 5 MG tablet Take 1 tablet (5 mg total) by mouth at bedtime. 30 tablet 0    PTA Medications:  PTA Medications  Medication Sig   OLANZapine (ZYPREXA) 5 MG tablet Take 1 tablet (5 mg total) by mouth at bedtime.   Facility Ordered Medications  Medication   acetaminophen (TYLENOL) tablet 650 mg   alum & mag hydroxide-simeth (MAALOX/MYLANTA) 200-200-20 MG/5ML suspension 30 mL   magnesium hydroxide (MILK OF MAGNESIA) suspension 30 mL   traZODone (DESYREL) tablet 50 mg   OLANZapine (ZYPREXA) tablet 5 mg       12/29/2017   11:01 AM 12/22/2017   10:39 AM 12/16/2017   10:56 AM  Depression screen PHQ 2/9  Decreased Interest 0 0 0  Down, Depressed, Hopeless 0 0 0  PHQ - 2 Score 0 0 0    Flowsheet Row ED [facility]13[facility]20[facility] in [facility]  C-SSRS RISK CATEGORY Low Risk No Risk       Musculoskeletal  Strength & Muscle Tone: within normal limits Gait & Station: normal Patient leans: N/A  Psychiatric Specialty Exam  Presentation  General Appearance:  Appropriate for Environment;  Casual  Eye Contact: Good  Speech: Clear and Coherent; Normal Rate  Speech Volume: Normal  Handedness: Right   Mood and Affect  Mood: Euthymic  Affect: Congruent   Thought Process  Thought Processes: Coherent  Descriptions of Associations:Intact  Orientation:Full (Time, Place and Person)  Thought Content:Logical  Diagnosis of Schizophrenia or Schizoaffective disorder in past: No  Duration of Psychotic Symptoms: Greater than six months   Hallucinations:Hallucinations: None  Ideas of Reference:None  Suicidal Thoughts:No data recorded Homicidal Thoughts:No data recorded  Sensorium  Memory: Immediate Good; Remote Good; Recent Good  Judgment: Good  Insight: Good   Executive Functions  Concentration: Good  Attention Span: Good  Recall: Good  Fund of Knowledge: Good  Language: Good   Psychomotor Activity  Psychomotor Activity:Psychomotor Activity: Normal   Assets  Assets: Communication Skills; Desire for Improvement; Physical Health; Resilience; Social Support   Sleep  Sleep:Sleep: Good   No data recorded  Physical Exam  Physical Exam Vitals and nursing note reviewed.  Constitutional:      Appearance: Normal appearance.  Eyes:     General:        Right eye: No discharge.        Left eye: No  discharge.     Conjunctiva/sclera: Conjunctivae normal.  Cardiovascular:     Rate and Rhythm: Normal rate.  Pulmonary:     Effort: No respiratory distress.  Musculoskeletal:        General: Normal range of motion.     Cervical back: Normal range of motion.  Skin:    Coloration: Skin is not jaundiced or pale.  Neurological:     Mental Status: He is alert and oriented to person, place, and time.  Psychiatric:        Attention and Perception: Attention and perception normal.        Mood and Affect: Mood and affect normal.        Speech: Speech normal.        Behavior: Behavior normal. Behavior is cooperative.        Thought  Content: Thought content normal.        Cognition and Memory: Cognition normal.        Judgment: Judgment normal.    Review of Systems  Constitutional: Negative.   HENT: Negative.    Eyes: Negative.   Respiratory: Negative.    Cardiovascular: Negative.   Musculoskeletal: Negative.   Skin: Negative.   Neurological: Negative.   Psychiatric/Behavioral: Negative.     Blood pressure 126/86, pulse 96, temperature 98.1 F (36.7 C), temperature source Oral, resp. rate 18, SpO2 97%. There is no height or weight on file to calculate BMI.  Demographic Factors:  Male and Adolescent or young adult  Loss Factors: NA  Historical Factors: NA  Risk Reduction Factors:   Responsible for children under 29 years of age, Sense of responsibility to family, Religious beliefs about death, Employed, Living with another person, especially a relative, Positive social support, Positive therapeutic relationship, and Positive coping skills or problem solving skills  Continued Clinical Symptoms:  Depression:   Impulsivity  Cognitive Features That Contribute To Risk:  None    Suicide Risk:  Minimal: No identifiable suicidal ideation.  Patients presenting with no risk factors but with morbid ruminations; may be classified as minimal risk based on the severity of the depressive symptoms  Plan Of Care/Follow-up recommendations:  Activity:  as tolerated  Diet:  regular   Disposition:   Discharge patient   Provided 30- day prescription for Zyprexa 5 mg at bedtime .  Patient agrees to present to [facility] this week for for open access walk in appointments for [facility] on the second floor for medication management and therapy.   Safety planning/suicide intervention completed .  Ardis Hughs, NP 10/15/2023, 12:22 PM

## 2023-10-15 NOTE — ED Notes (Signed)
Patient discharged home per provider order. After Visit Summary (AVS) printed and given to patient. AVS reviewed with patient and all questions fully answered. Patient discharged in no acute distress, A& O x4 and ambulatory. Patient denied SI/HI, A/VH upon discharge. Patient verbalized understanding of all discharge instructions explained by staff. Patient had no belongings to return upon discharge. Patient voiced permission to review AVS with wife. AVS reviewed, all questions answered. Patient escorted to lobby via staff for transport to destination. Safety maintained.

## 2023-10-15 NOTE — ED Notes (Signed)
Patient alert & oriented x4. Denies intent to harm self or others when asked. Denies A/VH. Patient denies any physical complaints when asked. Patient slow to answer but answers all questions appropriately. No acute distress noted. Support and encouragement provided. Routine safety checks conducted per facility protocol. Encouraged patient to notify staff if any thoughts of harm towards self or others arise. Patient verbalizes understanding and agreement.

## 2023-10-15 NOTE — Discharge Instructions (Signed)
Discharge recommendations:   Medications: Patient is to take medications as prescribed. The patient or patient's guardian is to contact a medical professional and/or outpatient provider to address any new side effects that develop. The patient or the patient's guardian should update outpatient providers of any new medications and/or medication changes.    Outpatient Follow up: Please review list of outpatient resources for psychiatry and counseling. Please follow up with your primary care provider for all medical related needs.    Therapy: We recommend that patient participate in individual therapy to address mental health concerns.   Atypical antipsychotics: If you are prescribed an atypical antipsychotic, it is recommended that your height, weight, BMI, blood pressure, fasting lipid panel, and fasting blood sugar be monitored by your outpatient providers.  Safety:   The following safety precautions should be taken:   No sharp objects. This includes scissors, razors, scrapers, and putty knives.   Chemicals should be removed and locked up.   Medications should be removed and locked up.   Weapons should be removed and locked up. This includes firearms, knives and instruments that can be used to cause injury.   The patient should abstain from use of illicit substances/drugs and abuse of any medications.  If symptoms worsen or do not continue to improve or if the patient becomes actively suicidal or homicidal then it is recommended that the patient return to the closest hospital emergency department, the [facility], or call 911 for further evaluation and treatment. National Suicide Prevention Lifeline 1-800-SUICIDE or 567-265-1791.  About 988 988 offers 24/7 access to trained crisis counselors who can help people experiencing mental health-related distress. People can call or text 988 or chat 988lifeline.org for themselves or if they are worried about a  loved one who may need crisis support.   Based on what you have shared, a list of resources for outpatient therapy and psychiatry is provided below to get you started back on treatment.  It is imperative that you follow through with treatment within 5-7 days from the day of discharge to prevent any further risk to your safety or mental well-being.  You are not limited to the list provided.  In case of an urgent crisis, you may contact the Mobile Crisis Unit with [facility] at 1.562-460-1106.        Outpatient Services for Therapy and Medication Management for [facility] [address], [place], [address] [phone] phone  New Patient Assessment/Therapy Walk-ins Monday and Wednesday: 8am until slots are full. Every 1st and 2nd Friday: 1pm - 5pm  NO ASSESSMENT/THERAPY WALK-INS ON TUESDAYS OR THURSDAYS  New Patient Psychiatry/Medication Management Walk-ins Monday-Friday: 8am-11am  For all walk-ins, we ask that you arrive by 7:30am because patient will be seen in the order of arrival.  Availability is limited; therefore, you may not be seen on the same day that you walk-in.  Our goal is to serve and meet the needs of our community to the best of our ability.   [facility] [address] [place], [place], [address] [phone] phone  [facility] [address] [place], [place], [address] [phone] phone [phone] phone ([address], [employer], [address], [address], [address], [address], Healthy [place], [place], [place], [address], [employer], [place], UHC, [employer], [place], Out of Network)  [employer], [place] [address] [place], [place], [address] [phone] phone (Monia Pouch, [employer], [employer], [place],  [address], [place], [place], [place], [place], [employer],  [place], [place], [place], [facility])  [employer] 970 438 [address] [place], [place], [address] [phone] phone (Medicaid, ask about other insurance)  [facility] [address] [place], [place], [address] [phone] phone [phone] fax ([address] [place] , [place], [place], [place] Health Choice, UHC, [employer], Self-Pay)  [facility] [address] [place], [place], [address] [phone] phone ([address] Anthem/[employer]vance, [address], [address], [place], [employer], [place], [place], [place], [employer], [place], [place], [place], [facility])  [facility] - 6-8 MONTH WAIT FOR THERAPY; SOONER FOR MEDICATION MANAGEMENT [address], [address] [place], [place], [address] [phone] phone ([address] [employer] [address] - [place], [address], [place], [place], [employer], [address], BCBS Healthy [place], [place], [address], [place], [place], [place], [place], Tricare, UHC, [employer], [place])  [facility] [address], [address] [place], [place], [address] [phone] phone  [facility] [address], [address] [place], [place], [address] [phone] phone  [facility] [address] [place], [place], [address] [phone] phone  [facility] [place] - THERAPY ONLY [address] [place], [address] [phone] phone  [facility], [place] [address], [place], [address] [phone] phone  [facility] [address] [place], [place], [address] [phone] phone [phone] fax  [facility] [address] [place], [place], [address] [phone] phone [phone] fax  [facility] [phone] [address] [place], [place], [address] [phone] phone  Jovita Kussmaul [address] [place], [place], [address]  [phone] phone  [facility]   (Adults Only) [address] [place], [place], [address]  [phone] phone [phone] fax
